# Patient Record
Sex: Female | Born: 1955
Health system: Southern US, Community
[De-identification: ages and names within clinical notes are randomized; demographics above are authoritative.]

## PROBLEM LIST (undated history)

## (undated) DIAGNOSIS — H269 Unspecified cataract: Secondary | ICD-10-CM

## (undated) DIAGNOSIS — I10 Essential (primary) hypertension: Secondary | ICD-10-CM

## (undated) DIAGNOSIS — J302 Other seasonal allergic rhinitis: Secondary | ICD-10-CM

## (undated) DIAGNOSIS — M1990 Unspecified osteoarthritis, unspecified site: Secondary | ICD-10-CM

## (undated) DIAGNOSIS — D649 Anemia, unspecified: Secondary | ICD-10-CM

## (undated) DIAGNOSIS — F4310 Post-traumatic stress disorder, unspecified: Secondary | ICD-10-CM

## (undated) DIAGNOSIS — E119 Type 2 diabetes mellitus without complications: Secondary | ICD-10-CM

## (undated) DIAGNOSIS — F32A Depression, unspecified: Secondary | ICD-10-CM

## (undated) DIAGNOSIS — F419 Anxiety disorder, unspecified: Secondary | ICD-10-CM

## (undated) DIAGNOSIS — F329 Major depressive disorder, single episode, unspecified: Secondary | ICD-10-CM

## (undated) DIAGNOSIS — E785 Hyperlipidemia, unspecified: Secondary | ICD-10-CM

## (undated) HISTORY — PX: TUBAL LIGATION: SHX77

## (undated) HISTORY — DX: Other seasonal allergic rhinitis: J30.2

## (undated) HISTORY — DX: Unspecified osteoarthritis, unspecified site: M19.90

## (undated) HISTORY — DX: Unspecified cataract: H26.9

## (undated) HISTORY — PX: ABDOMINAL HYSTERECTOMY: SHX81

## (undated) HISTORY — DX: Hyperlipidemia, unspecified: E78.5

---

## 1988-11-01 HISTORY — PX: TUBAL LIGATION: SHX77

## 2001-11-01 DIAGNOSIS — Z5189 Encounter for other specified aftercare: Secondary | ICD-10-CM

## 2001-11-01 HISTORY — PX: ABDOMINAL HYSTERECTOMY: SHX81

## 2001-11-01 HISTORY — DX: Encounter for other specified aftercare: Z51.89

## 2003-11-02 ENCOUNTER — Emergency Department (HOSPITAL_COMMUNITY): Admission: EM | Admit: 2003-11-02 | Discharge: 2003-11-02 | Payer: Self-pay | Admitting: Emergency Medicine

## 2004-09-05 ENCOUNTER — Emergency Department (HOSPITAL_COMMUNITY): Admission: EM | Admit: 2004-09-05 | Discharge: 2004-09-05 | Payer: Self-pay | Admitting: Emergency Medicine

## 2005-03-12 ENCOUNTER — Emergency Department (HOSPITAL_COMMUNITY): Admission: EM | Admit: 2005-03-12 | Discharge: 2005-03-12 | Payer: Self-pay | Admitting: Emergency Medicine

## 2006-03-21 ENCOUNTER — Ambulatory Visit: Payer: Self-pay | Admitting: Family Medicine

## 2006-04-08 ENCOUNTER — Emergency Department (HOSPITAL_COMMUNITY): Admission: EM | Admit: 2006-04-08 | Discharge: 2006-04-08 | Payer: Self-pay | Admitting: Emergency Medicine

## 2007-03-01 ENCOUNTER — Emergency Department (HOSPITAL_COMMUNITY): Admission: EM | Admit: 2007-03-01 | Discharge: 2007-03-01 | Payer: Self-pay | Admitting: Emergency Medicine

## 2007-05-13 ENCOUNTER — Emergency Department (HOSPITAL_COMMUNITY): Admission: EM | Admit: 2007-05-13 | Discharge: 2007-05-13 | Payer: Self-pay | Admitting: Emergency Medicine

## 2008-10-19 ENCOUNTER — Emergency Department (HOSPITAL_COMMUNITY): Admission: EM | Admit: 2008-10-19 | Discharge: 2008-10-19 | Payer: Self-pay | Admitting: Emergency Medicine

## 2009-04-22 ENCOUNTER — Encounter (INDEPENDENT_AMBULATORY_CARE_PROVIDER_SITE_OTHER): Payer: Self-pay | Admitting: Emergency Medicine

## 2009-04-22 ENCOUNTER — Emergency Department (HOSPITAL_COMMUNITY): Admission: EM | Admit: 2009-04-22 | Discharge: 2009-04-22 | Payer: Self-pay | Admitting: Emergency Medicine

## 2009-04-22 ENCOUNTER — Ambulatory Visit: Payer: Self-pay | Admitting: Surgery

## 2009-10-17 ENCOUNTER — Encounter: Admission: RE | Admit: 2009-10-17 | Discharge: 2009-10-17 | Payer: Self-pay | Admitting: Obstetrics and Gynecology

## 2009-11-01 HISTORY — PX: COLONOSCOPY: SHX174

## 2011-08-06 LAB — CBC
HCT: 42.9 % (ref 36.0–46.0)
Hemoglobin: 14.1 g/dL (ref 12.0–15.0)
MCHC: 32.8 g/dL (ref 30.0–36.0)
MCV: 87.3 fL (ref 78.0–100.0)
Platelets: 271 10*3/uL (ref 150–400)
RBC: 4.92 MIL/uL (ref 3.87–5.11)
RDW: 13.8 % (ref 11.5–15.5)
WBC: 5.8 10*3/uL (ref 4.0–10.5)

## 2011-08-06 LAB — URINALYSIS, ROUTINE W REFLEX MICROSCOPIC
Bilirubin Urine: NEGATIVE
Glucose, UA: NEGATIVE mg/dL
Ketones, ur: NEGATIVE mg/dL
Nitrite: NEGATIVE
Protein, ur: NEGATIVE mg/dL
Specific Gravity, Urine: 1.025 (ref 1.005–1.030)
Urobilinogen, UA: 0.2 mg/dL (ref 0.0–1.0)
pH: 6 (ref 5.0–8.0)

## 2011-08-06 LAB — BASIC METABOLIC PANEL
BUN: 8 mg/dL (ref 6–23)
CO2: 26 mEq/L (ref 19–32)
Calcium: 9.3 mg/dL (ref 8.4–10.5)
Chloride: 105 mEq/L (ref 96–112)
Creatinine, Ser: 0.69 mg/dL (ref 0.4–1.2)
GFR calc Af Amer: >60 mL/min (ref >60)
GFR calc non Af Amer: >60 mL/min (ref >60)
Glucose, Bld: 135 mg/dL — ABNORMAL HIGH (ref 70–99)
Potassium: 3.2 mEq/L — ABNORMAL LOW (ref 3.5–5.1)
Sodium: 139 mEq/L (ref 135–145)

## 2011-08-06 LAB — DIFFERENTIAL
Basophils Absolute: 0 10*3/uL (ref 0.0–0.1)
Basophils Relative: 0 % (ref 0–1)
Eosinophils Absolute: 0.2 10*3/uL (ref 0.0–0.7)
Eosinophils Relative: 3 % (ref 0–5)
Lymphocytes Relative: 29 % (ref 12–46)
Lymphs Abs: 1.7 10*3/uL (ref 0.7–4.0)
Monocytes Absolute: 0.3 10*3/uL (ref 0.1–1.0)
Monocytes Relative: 5 % (ref 3–12)
Neutro Abs: 3.6 10*3/uL (ref 1.7–7.7)
Neutrophils Relative %: 63 % (ref 43–77)

## 2011-08-06 LAB — URINE MICROSCOPIC-ADD ON

## 2011-08-06 LAB — URINE CULTURE: Colony Count: 40000

## 2011-11-18 ENCOUNTER — Emergency Department (HOSPITAL_BASED_OUTPATIENT_CLINIC_OR_DEPARTMENT_OTHER)
Admission: EM | Admit: 2011-11-18 | Discharge: 2011-11-18 | Disposition: A | Discharge status: Home / Self Care | Payer: No Typology Code available for payment source | Attending: Emergency Medicine | Admitting: Emergency Medicine

## 2011-11-18 ENCOUNTER — Encounter (HOSPITAL_BASED_OUTPATIENT_CLINIC_OR_DEPARTMENT_OTHER): Payer: Self-pay | Admitting: Emergency Medicine

## 2011-11-18 DIAGNOSIS — S161XXA Strain of muscle, fascia and tendon at neck level, initial encounter: Secondary | ICD-10-CM

## 2011-11-18 DIAGNOSIS — Y92410 Unspecified street and highway as the place of occurrence of the external cause: Secondary | ICD-10-CM | POA: Insufficient documentation

## 2011-11-18 DIAGNOSIS — M25559 Pain in unspecified hip: Secondary | ICD-10-CM

## 2011-11-18 DIAGNOSIS — S139XXA Sprain of joints and ligaments of unspecified parts of neck, initial encounter: Secondary | ICD-10-CM | POA: Insufficient documentation

## 2011-11-18 MED ORDER — METHOCARBAMOL 500 MG PO TABS: 500.0000 mg | ORAL_TABLET | Freq: Two times a day (BID) | ORAL | Status: AC

## 2011-11-18 MED ORDER — DEXAMETHASONE SODIUM PHOSPHATE 10 MG/ML IJ SOLN
10.0000 mg | Freq: Once | INTRAMUSCULAR | Status: AC
  Administered 2011-11-18: 10 mg via INTRAMUSCULAR
  Filled 2011-11-18: qty 1

## 2011-11-18 MED ORDER — METHOCARBAMOL 500 MG PO TABS
500.0000 mg | ORAL_TABLET | Freq: Once | ORAL | Status: AC
  Administered 2011-11-18: 500 mg via ORAL
  Filled 2011-11-18: qty 1

## 2011-11-18 MED ORDER — TRAMADOL HCL 50 MG PO TABS: 50.0000 mg | ORAL_TABLET | Freq: Four times a day (QID) | ORAL | Status: AC | PRN

## 2011-11-18 MED ORDER — KETOROLAC TROMETHAMINE 60 MG/2ML IM SOLN
60.0000 mg | Freq: Once | INTRAMUSCULAR | Status: AC
  Administered 2011-11-18: 60 mg via INTRAMUSCULAR
  Filled 2011-11-18: qty 2
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
## 2011-11-22 NOTE — ED Provider Notes (Signed)
Medical screening examination/treatment/procedure(s) were performed by non-physician practitioner and as supervising physician I was immediately available for consultation/collaboration.  Toy Baker, MD 11/22/11 1539

## 2012-06-20 ENCOUNTER — Emergency Department (HOSPITAL_COMMUNITY)
Admission: EM | Admit: 2012-06-20 | Discharge: 2012-06-20 | Disposition: A | Discharge status: Home / Self Care | Payer: Self-pay | Attending: Emergency Medicine | Admitting: Emergency Medicine

## 2012-06-20 ENCOUNTER — Other Ambulatory Visit: Payer: Self-pay

## 2012-06-20 ENCOUNTER — Encounter (HOSPITAL_COMMUNITY): Payer: Self-pay | Admitting: *Deleted

## 2012-06-20 DIAGNOSIS — R55 Syncope and collapse: Secondary | ICD-10-CM | POA: Insufficient documentation

## 2012-06-20 HISTORY — DX: Anemia, unspecified: D64.9

## 2012-06-20 LAB — BASIC METABOLIC PANEL
BUN: 6 mg/dL (ref 6–23)
CO2: 25 mEq/L (ref 19–32)
Calcium: 9 mg/dL (ref 8.4–10.5)
Chloride: 104 mEq/L (ref 96–112)
Creatinine, Ser: 0.67 mg/dL (ref 0.50–1.10)
GFR calc Af Amer: >90 mL/min (ref >90)
GFR calc non Af Amer: >90 mL/min (ref >90)
Glucose, Bld: 86 mg/dL (ref 70–99)
Potassium: 3.4 mEq/L — ABNORMAL LOW (ref 3.5–5.1)
Sodium: 139 mEq/L (ref 135–145)

## 2012-06-20 LAB — URINALYSIS, ROUTINE W REFLEX MICROSCOPIC
Bilirubin Urine: NEGATIVE
Glucose, UA: NEGATIVE mg/dL
Ketones, ur: NEGATIVE mg/dL
Leukocytes, UA: NEGATIVE
Nitrite: NEGATIVE
Protein, ur: NEGATIVE mg/dL
Specific Gravity, Urine: 1.008 (ref 1.005–1.030)
Urobilinogen, UA: 1 mg/dL (ref 0.0–1.0)
pH: 6.5 (ref 5.0–8.0)

## 2012-06-20 LAB — URINE MICROSCOPIC-ADD ON

## 2012-06-20 LAB — CBC
HCT: 40.2 % (ref 36.0–46.0)
Hemoglobin: 13.3 g/dL (ref 12.0–15.0)
MCH: 28.2 pg (ref 26.0–34.0)
MCHC: 33.1 g/dL (ref 30.0–36.0)
MCV: 85.4 fL (ref 78.0–100.0)
Platelets: 212 10*3/uL (ref 150–400)
RBC: 4.71 MIL/uL (ref 3.87–5.11)
RDW: 14.3 % (ref 11.5–15.5)
WBC: 6.4 10*3/uL (ref 4.0–10.5)

## 2012-06-20 LAB — TROPONIN I: Troponin I: <0.3 ng/mL (ref <0.30)

## 2012-06-20 MED ORDER — SODIUM CHLORIDE 0.9 % IV BOLUS (SEPSIS)
1000.0000 mL | Freq: Once | INTRAVENOUS | Status: AC
  Administered 2012-06-20: 1000 mL via INTRAVENOUS

## 2012-06-20 NOTE — ED Provider Notes (Signed)
History    D72-year-old female with near syncope. Earlier today patient was doing light housework when she began to feel shaky like her legs were going to collapse underneath her. Assisted to floor by her son who was nearby. She did not fall. She did not strike her head. She is not sure if she completely lost consciousness. She denies chest pain or shortness of breath. No seizure activity was reported. There was no incontinence.   CSN: [id number]  Arrival date & time 06/20/12  1542   First MD Initiated Contact with Patient 06/20/12 1550      Chief Complaint  Patient presents with  . Loss of Consciousness    (Consider location/radiation/quality/duration/timing/severity/associated sxs/prior treatment) HPI  Past Medical History  Diagnosis Date  . Anemia     Past Surgical History  Procedure Date  . Abdominal hysterectomy     History reviewed. No pertinent family history.  History  Substance Use Topics  . Smoking status: Not on file  . Smokeless tobacco: Not on file  . Alcohol Use:     OB History    Grav Para Term Preterm Abortions TAB SAB Ect Mult Living                  Review of Systems   Review of symptoms negative unless otherwise noted in HPI.   Allergies  Review of patient's allergies indicates no known allergies.  Home Medications   Current Outpatient Rx  Name Route Sig Dispense Refill  . VITAMIN D 1000 UNITS PO TABS Oral Take 1,000 Units by mouth daily.    Marland Kitchen FISH OIL 500 MG PO CAPS Oral Take 3 capsules by mouth daily.       BP 124/81  Pulse 78  Temp 98.5 F (36.9 C) (Oral)  Resp 20  SpO2 100%  Physical Exam  Nursing note and vitals reviewed. Constitutional: She is oriented to person, place, and time. She appears well-developed and well-nourished. No distress.       Laying in bed. NAD.  HENT:  Head: Normocephalic and atraumatic.  Eyes: Conjunctivae are normal. Pupils are equal, round, and reactive to light. Right eye exhibits no discharge.  Left eye exhibits no discharge.  Neck: Neck supple.  Cardiovascular: Normal rate, regular rhythm and normal heart sounds.  Exam reveals no gallop and no friction rub.   No murmur heard. Pulmonary/Chest: Effort normal and breath sounds normal. No respiratory distress.  Abdominal: Soft. She exhibits no distension. There is no tenderness.  Musculoskeletal: She exhibits no edema and no tenderness.       Lower extremities symmetric as compared to each other. No calf tenderness. Negative Homan's. No palpable cords.   Neurological: She is alert and oriented to person, place, and time. No cranial nerve deficit. She exhibits normal muscle tone. Coordination normal.  Skin: Skin is warm and dry. She is not diaphoretic.  Psychiatric: She has a normal mood and affect. Her behavior is normal. Thought content normal.    ED Course  Procedures (including critical care time)  Labs Reviewed  BASIC METABOLIC PANEL - Abnormal; Notable for the following:    Potassium 3.4 (*)     All other components within normal limits  URINALYSIS, ROUTINE W REFLEX MICROSCOPIC - Abnormal; Notable for the following:    APPearance HAZY (*)     Hgb urine dipstick TRACE (*)     All other components within normal limits  URINE MICROSCOPIC-ADD ON - Abnormal; Notable for the following:    Squamous  Epithelial / LPF FEW (*)     Bacteria, UA FEW (*)     All other components within normal limits  TROPONIN I  CBC  LAB REPORT - SCANNED   No results found.  EKG:  Rhythm: normal sinus Rate:  Axis: normal Intervals: normal ST segments: Ns ST changes anteriorly Comparison: stable from previous from 10/2008  1. Syncope       MDM  56yf with possible syncope. Etiology unclear. W/u unremarkable. HD stable. EKG with normal intervals. No hx of CHF. NO complaints through out ED stay. Return precautions discussed. Feel appropriate for outpt fu.        Raeford Razor, MD 06/25/12 [phone]

## 2012-06-20 NOTE — ED Notes (Signed)
Pt in via EMS- per EMS, pt in s/p syncopal episode, pt was working around house and stated her legs started to feel shaky and she started falling for the floor, son assisted patient to floor, pt did not fall. Per patient family pt did have syncopal episode, upon EMS arrival pt was alert and oriented, tearful. Pt denies neck or back pain, or other symptoms. Pt states she has not eaten today, has had one cup of coffee, CBG 92. Pt with history of similar episodes that PMD associated with anemia.

## 2013-03-21 ENCOUNTER — Other Ambulatory Visit: Payer: Self-pay

## 2013-03-21 ENCOUNTER — Other Ambulatory Visit: Payer: Self-pay | Admitting: Family Medicine

## 2013-04-04 ENCOUNTER — Other Ambulatory Visit (HOSPITAL_COMMUNITY): Payer: Self-pay | Admitting: Family Medicine

## 2013-04-04 DIAGNOSIS — Z1231 Encounter for screening mammogram for malignant neoplasm of breast: Secondary | ICD-10-CM

## 2013-04-12 ENCOUNTER — Ambulatory Visit (HOSPITAL_COMMUNITY)
Admission: RE | Admit: 2013-04-12 | Discharge: 2013-04-12 | Disposition: A | Discharge status: Home / Self Care | Payer: Self-pay | Source: Ambulatory Visit | Attending: Family Medicine | Admitting: Family Medicine

## 2013-04-12 DIAGNOSIS — Z1231 Encounter for screening mammogram for malignant neoplasm of breast: Secondary | ICD-10-CM

## 2013-06-15 ENCOUNTER — Encounter (HOSPITAL_COMMUNITY): Payer: Self-pay | Admitting: Emergency Medicine

## 2013-06-15 ENCOUNTER — Emergency Department (HOSPITAL_COMMUNITY)
Admission: EM | Admit: 2013-06-15 | Discharge: 2013-06-15 | Disposition: A | Discharge status: Home / Self Care | Attending: Emergency Medicine | Admitting: Emergency Medicine

## 2013-06-15 DIAGNOSIS — R55 Syncope and collapse: Secondary | ICD-10-CM | POA: Insufficient documentation

## 2013-06-15 DIAGNOSIS — Z79899 Other long term (current) drug therapy: Secondary | ICD-10-CM | POA: Insufficient documentation

## 2013-06-15 DIAGNOSIS — F329 Major depressive disorder, single episode, unspecified: Secondary | ICD-10-CM | POA: Insufficient documentation

## 2013-06-15 DIAGNOSIS — F411 Generalized anxiety disorder: Secondary | ICD-10-CM | POA: Insufficient documentation

## 2013-06-15 DIAGNOSIS — F3289 Other specified depressive episodes: Secondary | ICD-10-CM | POA: Insufficient documentation

## 2013-06-15 DIAGNOSIS — Z862 Personal history of diseases of the blood and blood-forming organs and certain disorders involving the immune mechanism: Secondary | ICD-10-CM | POA: Insufficient documentation

## 2013-06-15 NOTE — ED Notes (Signed)
Per EMS patient with Hx of depression was crying and lightheaded, then started to fall but caught herself. Per EMS this has happened before and usually occurs with stress, which she is feeling now due to lack of support system and employment issues. Patient is tearful, denies SI, denies HI.

## 2013-06-15 NOTE — ED Provider Notes (Addendum)
CSN: [id number]     Arrival date & time 06/15/13  1405 History     First MD Initiated Contact with Patient 06/15/13 1407     Chief Complaint  Patient presents with  . Loss of Consciousness   (Consider location/radiation/quality/duration/timing/severity/associated sxs/prior Treatment) HPI Comments: 57 year old female with a history of depression and anxiety who currently does not take any medications because she did not like the way to make her feel. She has 10 children, she lives with one of her daughters, each of the children participate in caring for the patient. She has no significant medical issues but she is unemployed and unable to find work. This has been a source of depression for the patient as she feels like she is not contributing to the family. She has had a lifelong history of anxiety and has syncopal episodes which becomes upset or anxious. She was on the phone call with one of her daughters when she became anxious, she hung up with the person in the a prompt syncopal episode. According to the daughter who accompanies her to the emergency department she has had this happen 100 sometimes in the past. She does not have any medical disorders prompting this according to the patient and her family members. She denies chest pain, shortness of breath, palpitations, headache, blurred vision, fevers chills nausea vomiting dysuria diarrhea or any other complaints.  Patient is a 57 y.o. female presenting with syncope. The history is provided by the patient, a relative and the EMS personnel.  Loss of Consciousness   Past Medical History  Diagnosis Date  . Anemia    Past Surgical History  Procedure Laterality Date  . Abdominal hysterectomy     History reviewed. No pertinent family history. History  Substance Use Topics  . Smoking status: Not on file  . Smokeless tobacco: Not on file  . Alcohol Use:    OB History   Grav Para Term Preterm Abortions TAB SAB Ect Mult Living       Review of Systems  Cardiovascular: Positive for syncope.  All other systems reviewed and are negative.    Allergies  Review of patient's allergies indicates no known allergies.  Home Medications   Current Outpatient Rx  Name  Route  Sig  Dispense  Refill  . omega-3 acid ethyl esters (LOVAZA) 1 G capsule   Oral   Take 1 g by mouth daily.          There were no vitals taken for this visit. Physical Exam  Nursing note and vitals reviewed. Constitutional: She appears well-developed and well-nourished. No distress.  HENT:  Head: Normocephalic and atraumatic.  Mouth/Throat: Oropharynx is clear and moist. No oropharyngeal exudate.  Eyes: Conjunctivae and EOM are normal. Pupils are equal, round, and reactive to light. Right eye exhibits no discharge. Left eye exhibits no discharge. No scleral icterus.  Neck: Normal range of motion. Neck supple. No JVD present. No thyromegaly present.  Cardiovascular: Normal rate, regular rhythm, normal heart sounds and intact distal pulses.  Exam reveals no gallop and no friction rub.   No murmur heard. Pulmonary/Chest: Effort normal and breath sounds normal. No respiratory distress. She has no wheezes. She has no rales.  Abdominal: Soft. Bowel sounds are normal. She exhibits no distension and no mass. There is no tenderness.  Musculoskeletal: Normal range of motion. She exhibits no edema and no tenderness.  Lymphadenopathy:    She has no cervical adenopathy.  Neurological: She is alert. Coordination normal.  Skin: Skin  is warm and dry. No rash noted. No erythema.  Psychiatric:  Tearful, anxious, no hallucinations, no suicidal thoughts    ED Course   Procedures (including critical care time)  Labs Reviewed - No data to display No results found. 1. Syncope   2. Depression     MDM  Overall the patient appears to be in good physical health, she has no cardiac abnormalities on exam, normal vital signs, soft nontender abdomen and a  clear mental status. She readily admits that her depression and anxiety comes from the fact that she is unable to contribute financially to her family and feels like a "leech".   The patient appears hemodynamically stable, she will get an EKG to evaluate for any other signs of arrhythmia however with no other symptoms and her frequent history of syncope there is no indication for further workup at this time. She refuses ongoing depression medications but will followup with psychiatry for ongoing counseling. She denies SI / HI to me though she has an ongoing feeling of guilt that her family members have to take care of her financially  I do not feel that she is a danger to herself or others at this time.  She lives with a daughter who she loves and loves her and is very loving interaction at the bedside.   ED ECG REPORT  I personally interpreted this EKG   Date: 06/15/2013   Rate: 71  Rhythm: normal sinus rhythm  QRS Axis: normal  Intervals: normal  ST/T Wave abnormalities: normal  Conduction Disutrbances:none  Narrative Interpretation:   Old EKG Reviewed: 06/20/12 -  no sig changes  No sig findings on exam, no arrhythmias, pt stable for d/c.    Vida Roller, MD 06/15/13 1515  Vida Roller, MD 06/15/13 1556

## 2013-06-15 NOTE — ED Notes (Signed)
MD at bedside. 

## 2013-06-15 NOTE — ED Notes (Signed)
Bed: [facility] Expected date:  Expected time:  Means of arrival:  Comments: EMS-syncopal

## 2014-05-22 ENCOUNTER — Other Ambulatory Visit (HOSPITAL_COMMUNITY): Payer: Self-pay | Admitting: Family Medicine

## 2014-05-22 DIAGNOSIS — Z1231 Encounter for screening mammogram for malignant neoplasm of breast: Secondary | ICD-10-CM

## 2014-05-31 ENCOUNTER — Ambulatory Visit (HOSPITAL_COMMUNITY)
Admission: RE | Admit: 2014-05-31 | Discharge: 2014-05-31 | Disposition: A | Discharge status: Home / Self Care | Source: Ambulatory Visit | Attending: Family Medicine | Admitting: Family Medicine

## 2014-05-31 DIAGNOSIS — Z1231 Encounter for screening mammogram for malignant neoplasm of breast: Secondary | ICD-10-CM

## 2015-04-23 ENCOUNTER — Other Ambulatory Visit (HOSPITAL_COMMUNITY): Payer: Self-pay | Admitting: Primary Care

## 2015-04-23 DIAGNOSIS — Z1231 Encounter for screening mammogram for malignant neoplasm of breast: Secondary | ICD-10-CM

## 2015-06-02 ENCOUNTER — Ambulatory Visit (HOSPITAL_COMMUNITY)
Admission: RE | Admit: 2015-06-02 | Discharge: 2015-06-02 | Disposition: A | Discharge status: Home / Self Care | Source: Ambulatory Visit | Attending: Primary Care | Admitting: Primary Care

## 2015-06-02 DIAGNOSIS — Z1231 Encounter for screening mammogram for malignant neoplasm of breast: Secondary | ICD-10-CM

## 2015-07-14 ENCOUNTER — Emergency Department (HOSPITAL_COMMUNITY)
Admission: EM | Admit: 2015-07-14 | Discharge: 2015-07-14 | Disposition: A | Discharge status: Other Acute Inpatient Hospital | Attending: Emergency Medicine | Admitting: Emergency Medicine

## 2015-07-14 ENCOUNTER — Inpatient Hospital Stay (HOSPITAL_COMMUNITY)
Admission: AD | Admit: 2015-07-14 | Discharge: 2015-07-17 | DRG: 885 | Disposition: A | Discharge status: Home / Self Care | Payer: Federal, State, Local not specified - Other | Source: Intra-hospital | Attending: Psychiatry | Admitting: Psychiatry

## 2015-07-14 ENCOUNTER — Encounter (HOSPITAL_COMMUNITY): Payer: Self-pay | Admitting: *Deleted

## 2015-07-14 ENCOUNTER — Encounter (HOSPITAL_COMMUNITY): Payer: Self-pay

## 2015-07-14 DIAGNOSIS — E119 Type 2 diabetes mellitus without complications: Secondary | ICD-10-CM | POA: Insufficient documentation

## 2015-07-14 DIAGNOSIS — F29 Unspecified psychosis not due to a substance or known physiological condition: Secondary | ICD-10-CM | POA: Clinically undetermined

## 2015-07-14 DIAGNOSIS — F329 Major depressive disorder, single episode, unspecified: Secondary | ICD-10-CM | POA: Diagnosis present

## 2015-07-14 DIAGNOSIS — I10 Essential (primary) hypertension: Secondary | ICD-10-CM | POA: Diagnosis present

## 2015-07-14 DIAGNOSIS — Z62810 Personal history of physical and sexual abuse in childhood: Secondary | ICD-10-CM | POA: Diagnosis present

## 2015-07-14 DIAGNOSIS — F419 Anxiety disorder, unspecified: Secondary | ICD-10-CM | POA: Insufficient documentation

## 2015-07-14 DIAGNOSIS — Z833 Family history of diabetes mellitus: Secondary | ICD-10-CM

## 2015-07-14 DIAGNOSIS — F23 Brief psychotic disorder: Principal | ICD-10-CM | POA: Clinically undetermined

## 2015-07-14 DIAGNOSIS — F4310 Post-traumatic stress disorder, unspecified: Secondary | ICD-10-CM | POA: Diagnosis present

## 2015-07-14 DIAGNOSIS — Z809 Family history of malignant neoplasm, unspecified: Secondary | ICD-10-CM

## 2015-07-14 DIAGNOSIS — Z79899 Other long term (current) drug therapy: Secondary | ICD-10-CM | POA: Insufficient documentation

## 2015-07-14 DIAGNOSIS — Z862 Personal history of diseases of the blood and blood-forming organs and certain disorders involving the immune mechanism: Secondary | ICD-10-CM | POA: Insufficient documentation

## 2015-07-14 HISTORY — DX: Anxiety disorder, unspecified: F41.9

## 2015-07-14 HISTORY — DX: Major depressive disorder, single episode, unspecified: F32.9

## 2015-07-14 HISTORY — DX: Type 2 diabetes mellitus without complications: E11.9

## 2015-07-14 HISTORY — DX: Depression, unspecified: F32.A

## 2015-07-14 HISTORY — DX: Essential (primary) hypertension: I10

## 2015-07-14 LAB — RAPID URINE DRUG SCREEN, HOSP PERFORMED
AMPHETAMINES: NOT DETECTED
BARBITURATES: NOT DETECTED
Benzodiazepines: NOT DETECTED
Cocaine: NOT DETECTED
Opiates: NOT DETECTED
TETRAHYDROCANNABINOL: NOT DETECTED

## 2015-07-14 LAB — COMPREHENSIVE METABOLIC PANEL
ALBUMIN: 4.6 g/dL (ref 3.5–5.0)
ALT: 18 U/L (ref 14–54)
AST: 29 U/L (ref 15–41)
Alkaline Phosphatase: 77 U/L (ref 38–126)
Anion gap: 8 (ref 5–15)
BILIRUBIN TOTAL: 0.3 mg/dL (ref 0.3–1.2)
BUN: 9 mg/dL (ref 6–20)
CHLORIDE: 106 mmol/L (ref 101–111)
CO2: 26 mmol/L (ref 22–32)
Calcium: 9.6 mg/dL (ref 8.9–10.3)
Creatinine, Ser: 0.9 mg/dL (ref 0.44–1.00)
GFR calc Af Amer: >60 mL/min (ref >60)
GFR calc non Af Amer: >60 mL/min (ref >60)
GLUCOSE: 104 mg/dL — AB (ref 65–99)
POTASSIUM: 3.5 mmol/L (ref 3.5–5.1)
SODIUM: 140 mmol/L (ref 135–145)
Total Protein: 8.6 g/dL — ABNORMAL HIGH (ref 6.5–8.1)

## 2015-07-14 LAB — CBC
HEMATOCRIT: 43.2 % (ref 36.0–46.0)
HEMOGLOBIN: 14.4 g/dL (ref 12.0–15.0)
MCH: 28 pg (ref 26.0–34.0)
MCHC: 33.3 g/dL (ref 30.0–36.0)
MCV: 83.9 fL (ref 78.0–100.0)
Platelets: 293 10*3/uL (ref 150–400)
RBC: 5.15 MIL/uL — ABNORMAL HIGH (ref 3.87–5.11)
RDW: 14.2 % (ref 11.5–15.5)
WBC: 6.4 10*3/uL (ref 4.0–10.5)

## 2015-07-14 LAB — ETHANOL: Alcohol, Ethyl (B): <5 mg/dL (ref <5)

## 2015-07-14 MED ORDER — HYDROXYZINE HCL 25 MG PO TABS: 25.0000 mg | ORAL_TABLET | Freq: Four times a day (QID) | ORAL | Status: DC | PRN

## 2015-07-14 MED ORDER — ALUM & MAG HYDROXIDE-SIMETH 200-200-20 MG/5ML PO SUSP: 30.0000 mL | ORAL | Status: DC | PRN

## 2015-07-14 MED ORDER — LORAZEPAM 1 MG PO TABS: 1.0000 mg | ORAL_TABLET | Freq: Three times a day (TID) | ORAL | Status: DC | PRN

## 2015-07-14 MED ORDER — TRAZODONE HCL 50 MG PO TABS: 50.0000 mg | ORAL_TABLET | Freq: Every evening | ORAL | Status: DC | PRN

## 2015-07-14 MED ORDER — ONDANSETRON HCL 4 MG PO TABS: 4.0000 mg | ORAL_TABLET | Freq: Three times a day (TID) | ORAL | Status: DC | PRN

## 2015-07-14 MED ORDER — ACETAMINOPHEN 325 MG PO TABS
650.0000 mg | ORAL_TABLET | ORAL | Status: DC | PRN
  Administered 2015-07-14: 650 mg via ORAL
  Filled 2015-07-14: qty 2

## 2015-07-14 MED ORDER — NICOTINE 21 MG/24HR TD PT24: 21.0000 mg | MEDICATED_PATCH | Freq: Every day | TRANSDERMAL | Status: DC

## 2015-07-14 MED ORDER — IBUPROFEN 200 MG PO TABS: 600.0000 mg | ORAL_TABLET | Freq: Three times a day (TID) | ORAL | Status: DC | PRN

## 2015-07-14 MED ORDER — MAGNESIUM HYDROXIDE 400 MG/5ML PO SUSP: 30.0000 mL | Freq: Every day | ORAL | Status: DC | PRN

## 2015-07-14 MED ORDER — ACETAMINOPHEN 325 MG PO TABS: 650.0000 mg | ORAL_TABLET | Freq: Four times a day (QID) | ORAL | Status: DC | PRN
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
## 2015-07-14 NOTE — Progress Notes (Signed)
Patient listed as having Tricare insurance without a pcp.  EDCM spoke to patient at bedside.  Patient reports she does not have [employer].  Patient confirms she has the orange card and her pcp is located at the [facility] [place].  System updated.

## 2015-07-14 NOTE — ED Notes (Signed)
Patient has been wanded by secuirty. Patient belongings were taken to the car by daughter Maudie Mercury.
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
## 2015-07-14 NOTE — Progress Notes (Signed)
59 year old female pt admitted on voluntary basis. On admission, pt reports that she had a breakdown earlier and her children brought her into the hospital because they were concerned about her. Pt denies any feelings of depression or SI on admission but did endorse that she was feeling suicidal earlier. Pt able to contract for safety on the unit. Pt reports that she lives with her daughter and will go back there after discharge. Pt reports that she needs to get rid of her past. Pt denies any recent current mental health treatment and denies being on any medications other than some BP medications. Pt was oriented to unit and safety maintained.

## 2015-07-14 NOTE — ED Notes (Signed)
Patient goes by "Katie Simon". Pleasant, cooperative. Denies complaint. Denies SI, HI, AVH and feelings of depression and anxiety at present. Hx of syncope, reports no falls in the last year. Patient has support of her family with multiple visitors.  Encouragement offered.  Q 15 safety checks in place.
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
## 2015-07-14 NOTE — ED Notes (Signed)
Patient ambulated back to the unit from triage.  She was oriented to the unit and to her surroundings.  She states she is no longer hallucinating or having bad thoughts like she was when she came in today.  She is pleasant and makes good eye contact.  When advised she would likely have to stay the night and see the psychiatrist in the morning.  She stated that was fine.  She requested a snack and some ice water which I provided.
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
## 2015-07-14 NOTE — Progress Notes (Signed)
Received report on patient from the admit nurse    Introduced myself to pt as she was in her room getting ready to take a shower    She said all she needed to do was take a shower and go to bed for the night   She was pleasant and cooperative during our brief interaction    Reassured pt of safety and staff  availability for her during the night

## 2015-07-14 NOTE — BH Assessment (Signed)
Informed Tatyana, PA of acceptance to [facility] bed, 504-2 under the care of Dr. Shea Evans. Megan RN aware. Support paperwork has been completed. Megan RN coordinating with adult unit for arrival time.    Lear Ng, [facility] Triage Specialist 07/14/2015 10:09 PM

## 2015-07-15 ENCOUNTER — Encounter (HOSPITAL_COMMUNITY): Payer: Self-pay | Admitting: Psychiatry

## 2015-07-15 DIAGNOSIS — F4310 Post-traumatic stress disorder, unspecified: Secondary | ICD-10-CM

## 2015-07-15 DIAGNOSIS — F29 Unspecified psychosis not due to a substance or known physiological condition: Secondary | ICD-10-CM | POA: Clinically undetermined

## 2015-07-15 MED ORDER — HYDROCHLOROTHIAZIDE 25 MG PO TABS
25.0000 mg | ORAL_TABLET | Freq: Every day | ORAL | Status: DC
  Administered 2015-07-15 – 2015-07-17 (×3): 25 mg via ORAL
  Filled 2015-07-15 (×5): qty 1

## 2015-07-15 NOTE — Progress Notes (Signed)
D: Undersign assisted pt in filling out self inventory form; pt reports she does not currently have her reading classes. Pt behavior calm and cooperative. Pleasant during conversation. Per self inventory form pt reports she slept fair last night. She reports a fair appetite, normal energy level, good concentration. She rates depression 1/10, hopelessness 0/10, anxiety 0/10- all on 0-10 scale, 10 being the worse. She denies SI/HI. Denies AVH. She denies physical problems and physical pain. Pt reports her goal for the day is "getting better, to get home." She reports "talk to someone" will help her meet her goal. Pt attended nursing group on the unit. Active participation in group.  A:Special checks q 15 mins in place for safety.  Encouragement and support provided.   R: Safety maintained. Will continue to monitor.
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
## 2015-07-15 NOTE — BHH Suicide Risk Assessment (Signed)
[facility] Admission Suicide Risk Assessment   Nursing information obtained from:    Demographic factors:    Current Mental Status:    Loss Factors:    Historical Factors:    Risk Reduction Factors:    Total Time spent with patient: 30 minutes Principal Problem: PTSD (post-traumatic stress disorder) Diagnosis:   Patient Active Problem List   Diagnosis Date Noted  . PTSD (post-traumatic stress disorder) [F43.10] 07/15/2015  . Psychosis [F29] 07/15/2015     Continued Clinical Symptoms:  Alcohol Use Disorder Identification Test Final Score (AUDIT): 0 The "Alcohol Use Disorders Identification Test", Guidelines for Use in Primary Care, Second Edition.  World Pharmacologist [facility]). Score between 0-7:  no or low risk or alcohol related problems. Score between 8-15:  moderate risk of alcohol related problems. Score between 16-19:  high risk of alcohol related problems. Score 20 or above:  warrants further diagnostic evaluation for alcohol dependence and treatment.   CLINICAL FACTORS:   Previous Psychiatric Diagnoses and Treatments Medical Diagnoses and Treatments/Surgeries   Musculoskeletal: Strength & Muscle Tone: within normal limits Gait & Station: normal Patient leans: N/A  Psychiatric Specialty Exam: Physical Exam  ROS  Blood pressure 136/70, pulse 66, temperature 98.3 F (36.8 C), temperature source Oral, resp. rate 16, height 4\' 11"  (1.499 m), weight 77.565 kg (171 lb).Body mass index is 34.52 kg/(m^2).  Please see H&P.                                                        COGNITIVE FEATURES THAT CONTRIBUTE TO RISK:  Closed-mindedness, Polarized thinking and Thought constriction (tunnel vision)    SUICIDE RISK:   Mild:  Suicidal ideation of limited frequency, intensity, duration, and specificity.  There are no identifiable plans, no associated intent, mild dysphoria and related symptoms, good self-control (both objective and subjective  assessment), few other risk factors, and identifiable protective factors, including available and accessible social support.  PLAN OF CARE: Please see H&P.   Medical Decision Making:  Review of Psycho-Social Stressors (1), Review or order clinical lab tests (1), Established Problem, Worsening (2), Review of Medication Regimen & Side Effects (2) and Review of New Medication or Change in Dosage (2)  I certify that inpatient services furnished can reasonably be expected to improve the patient's condition.   Tyquan Carmickle md 07/15/2015, 1:05 PM

## 2015-07-15 NOTE — BHH Group Notes (Signed)
[place] Group Notes:  (Nursing/MHT/Case Management/Adjunct)  Date:  07/15/2015  Time:0930  Type of Therapy:  Nurse Education  Participation Level:  Active  Participation Quality:  Appropriate and Attentive  Affect:  Appropriate  Cognitive:  Alert and Appropriate  Insight:  Appropriate  Engagement in Group:  Engaged  Modes of Intervention:  Activity, Discussion, Education, Orientation, Socialization and Support  Summary of Progress/Problems:  Forrestine Him 07/15/2015, 10:18 AM
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
## 2015-07-15 NOTE — Plan of Care (Signed)
Problem: Diagnosis: Increased Risk For Suicide Attempt Goal: STG-Patient Will Comply With Medication Regime Outcome: Progressing Pt compliant with medication regimen     

## 2015-07-15 NOTE — [facility] Group Notes (Signed)
[place] [place] LCSW Group Therapy  07/15/2015 1:15 pm  Type of Therapy: Process Group Therapy  Participation Level:  Active  Participation Quality:  Appropriate  Affect:  Flat  Cognitive:  Oriented  Insight:  Improving  Engagement in Group:  Limited  Engagement in Therapy:  Limited  Modes of Intervention:  Activity, Clarification, Education, Problem-solving and Support  Summary of Progress/Problems: Today's group addressed the issue of overcoming obstacles.  Patients were asked to identify their biggest obstacle post d/c that stands in the way of their on-going success, and then problem solve as to how to manage this.  Stayed the entire time.  Engaged throughout.  Talked about challenged with her family.  Feels guilty when she does not do for them, but also realizes she is more focused on them than taking care of herself, and that has led to problems.  "I mean, why am I invested in a relationship with someone when the only person showing investment is me?"  Talked about the power of prayer and faith for her, as well as the support of her church family.  Roque Lias B 07/15/2015   5:07 PM

## 2015-07-15 NOTE — Plan of Care (Signed)
Problem: Diagnosis: Increased Risk For Suicide Attempt Goal: STG-Patient Will Attend All Groups On The Unit Outcome: Progressing Pt attending groups on the unit.

## 2015-07-15 NOTE — Progress Notes (Signed)
Adult Psychoeducational Group Note  Date:  07/15/2015 Time:  8:15 PM  Group Topic/Focus:  Wrap-Up Group:   The focus of this group is to help patients review their daily goal of treatment and discuss progress on daily workbooks.  Participation Level:  Active  Participation Quality:  Attentive  Affect:  Appropriate  Cognitive:  Appropriate  Insight: Good  Engagement in Group:  Engaged  Modes of Intervention:  Discussion  Additional Comments:  Pt was very pleasant during wrap-up group. Pt rated her overall day a 10 out of 10. Pt shared that her family, prayer, and praying with her family was what made her day so positive. Pt stated that she feels that she achieved her goal for the day which was to get better and sleep better.   Lincoln Brigham 07/15/2015, 8:45 PM

## 2015-07-16 DIAGNOSIS — F23 Brief psychotic disorder: Secondary | ICD-10-CM | POA: Clinically undetermined
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
## 2015-07-16 NOTE — Progress Notes (Signed)
Pt stated that she had an awesome day. She has learned a lot from groups and is looking forward to going home tomorrow. Her goal is to help her kids help her out and not fuss with them.
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
## 2015-07-16 NOTE — BHH Group Notes (Signed)
[facility] LCSW Group Therapy  07/16/2015 2:45 PM  Type of Therapy: Group Therapy  Participation Level: Active  Participation Quality: Attentive  Affect: Flat  Cognitive: Oriented  Insight: Limited  Engagement in Therapy: Engaged  Modes of Intervention: Discussion and Socialization  Summary of Progress/Problems: Katie Simon from the [place] was here to tell his story of recovery and play his guitar. Pt was pleasant and appropriate in group. She seemed interested in the discussion and stayed alert throughout the group.  Katie Simon. Katie Simon 07/16/2015 2:45 PM

## 2015-07-16 NOTE — BHH Suicide Risk Assessment (Signed)
[place] INPATIENT:  Family/Significant Other Suicide Prevention Education  Suicide Prevention Education:  Education Completed; Fradel Baldonado, son, [phone] has been identified by the patient as the family member/significant other with whom the patient will be residing, and identified as the person(s) who will aid the patient in the event of a mental health crisis (suicidal ideations/suicide attempt).  With written consent from the patient, the family member/significant other has been provided the following suicide prevention education, prior to the and/or following the discharge of the patient.  The suicide prevention education provided includes the following:  Suicide risk factors  Suicide prevention and interventions  National Suicide Hotline telephone number  [facility] assessment telephone number  [facility] Emergency Assistance [place] and/or Residential Mobile Crisis Unit telephone number  Request made of family/significant other to:  Remove weapons (e.g., guns, rifles, knives), all items previously/currently identified as safety concern.    Remove drugs/medications (over-the-counter, prescriptions, illicit drugs), all items previously/currently identified as a safety concern.  The family member/significant other verbalizes understanding of the suicide prevention education information provided.  The family member/significant other agrees to remove the items of safety concern listed above.  Roque Lias B 07/16/2015, 4:15 PM

## 2015-07-16 NOTE — Progress Notes (Signed)
Room changed 404/2

## 2015-07-16 NOTE — Progress Notes (Signed)
D: Pt is alert and oriented x 4. Pt at this time denies depression and anxiety; she states, "I feel real good; I do not have any depression and anxiety; the doctor said I may be leaving on Wednesday and I think am ready; feel just fine." Pt at this time also denes SI/HI/AVH and pain. Pt was observed having meaningful conversations with peers and staffs. Pt continues to be very pleasant and cooperative.   A: Support, encouragement, and safe environment provided.   R: Pt had no schedule medications, refused all PRNs offered. Pt attended group.  15-minute safety check continues

## 2015-07-16 NOTE — Plan of Care (Signed)
Problem: Alteration in mood Goal: LTG-Patient reports reduction in suicidal thoughts (Patient reports reduction in suicidal thoughts and is able to verbalize a safety plan for whenever patient is feeling suicidal)  Outcome: Progressing Pt denies suicidal thoughts at this time.

## 2015-07-16 NOTE — Progress Notes (Addendum)
D:Per patient self inventory form pt reports she slept good last night. She reports a good appetite, normal energy level, good concentration. She rates depression 0/10, hopelessness 0/10, anxiety 0/10- all on 0-10 scale, 10 being the worse. She denies SI/HI. Denies AVH. She denies physical pain. She denies physical problems. Pt pleasant on approach. She voices no complaints. Pt reports she had a good night and had visitors she reports her goal for the day is "to keep praying".  A: Special checks q 15 mins in place for safety. Medication administered per MD order (see eMAR). Encouragement and support provided.  R: Compliant with medication regimen. Safety maintained. Will continue to monitor.

## 2015-07-17 MED ORDER — HYDROXYZINE HCL 25 MG PO TABS: 25.0000 mg | ORAL_TABLET | Freq: Four times a day (QID) | ORAL | Status: DC | PRN

## 2015-07-17 MED ORDER — TRAZODONE HCL 50 MG PO TABS: 50.0000 mg | ORAL_TABLET | Freq: Every evening | ORAL | Status: DC | PRN

## 2015-07-17 MED ORDER — HYDROCHLOROTHIAZIDE 25 MG PO TABS: 25.0000 mg | ORAL_TABLET | Freq: Every day | ORAL | Status: DC

## 2015-07-17 NOTE — BHH Group Notes (Signed)
[employer] Group Notes:  (Nursing/MHT/Case Management/Adjunct)  Date:  07/17/2015  Time:  0900  Type of Therapy:  Nurse Education - Self Care  Participation Level:  Active  Participation Quality:  Appropriate  Affect:  Appropriate  Cognitive:  Appropriate  Insight:  Appropriate  Engagement in Group:  Engaged  Modes of Intervention:  Discussion and Education  Summary of Progress/Problems: Patient participated and shared in exercise and discussion regarding self care. Displayed appropriate insight and was engaged.   Loletta Specter [facility] 07/17/2015, [phone]

## 2015-07-17 NOTE — Progress Notes (Signed)
  [facility] Adult Case Management Discharge Plan :  Will you be returning to the same living situation after discharge:  Yes,  home with daughter At discharge, do you have transportation home?: Yes,  family Do you have the ability to pay for your medications: Yes,  No mental health meds  Release of information consent forms completed and in the chart;  Patient's signature needed at discharge.  Patient to Follow up at: Follow-up Information    Follow [facility] with [facility] On 07/22/2015.   Why:  Tuesday at 2:00 with Verlin Grills   Contact information:   [place] [phone]      Patient denies SI/HI: Yes,  yes    Safety Planning and Suicide Prevention discussed: Yes,  yes  Have you used any form of tobacco in the last 30 days? (Cigarettes, Smokeless Tobacco, Cigars, and/or Pipes): No  Has patient been referred to the Quitline?: N/A patient is not a smoker  [place] B 07/17/2015, 9:57 AM

## 2015-07-17 NOTE — BHH Suicide Risk Assessment (Signed)
[facility] Discharge Suicide Risk Assessment   Demographic Factors:  Divorced or widowed  Total Time spent with patient: 30 minutes  Musculoskeletal: Strength & Muscle Tone: within normal limits Gait & Station: normal Patient leans: N/A  Psychiatric Specialty Exam: Physical Exam  Review of Systems  Psychiatric/Behavioral: Negative for depression, suicidal ideas and hallucinations. The patient is not nervous/anxious and does not have insomnia.   All other systems reviewed and are negative.   Blood pressure 125/81, pulse 72, temperature 98.2 F (36.8 C), temperature source Oral, resp. rate 16, height 4\' 11"  (1.499 m), weight 77.565 kg (171 lb).Body mass index is 34.52 kg/(m^2).  General Appearance: Casual  Eye Contact::  Fair  Speech:  Clear and [id number]  Volume:  Normal  Mood:  Euthymic  Affect:  Appropriate  Thought Process:  Coherent  Orientation:  Full (Time, Place, and Person)  Thought Content:  WDL  Suicidal Thoughts:  No  Homicidal Thoughts:  No  Memory:  Immediate;   Fair Recent;   Fair Remote;   Fair  Judgement:  Fair  Insight:  Fair  Psychomotor Activity:  Normal  Concentration:  Fair  Recall:  [employer] of Knowledge:Fair  Language: Fair  Akathisia:  No  Handed:  Right  AIMS (if indicated):     Assets:  Communication Skills Desire for Improvement  Sleep:  Number of Hours: 6.75  Cognition: WNL  ADL's:  Intact   Have you used any form of tobacco in the last 30 days? (Cigarettes, Smokeless Tobacco, Cigars, and/or Pipes): No  Has this patient used any form of tobacco in the last 30 days? (Cigarettes, Smokeless Tobacco, Cigars, and/or Pipes) No  Mental Status Per Nursing Assessment::   On Admission:     Current Mental Status by Physician: Pt denies SI/HI/AH/VH  Loss Factors: NA  Historical Factors: Impulsivity  Risk Reduction Factors:   Sense of responsibility to family, Religious beliefs about death, Living with another person, especially a  relative, Positive social support, Positive therapeutic relationship and Positive coping skills or problem solving skills  Continued Clinical Symptoms:  Previous Psychiatric Diagnoses and Treatments  Cognitive Features That Contribute To Risk:  None    Suicide Risk:  Minimal: No identifiable suicidal ideation.  Patients presenting with no risk factors but with morbid ruminations; may be classified as minimal risk based on the severity of the depressive symptoms  Principal Problem: Brief psychotic disorder Discharge Diagnoses:  Patient Active Problem List   Diagnosis Date Noted  . Brief psychotic disorder [F23] 07/16/2015  . PTSD (post-traumatic stress disorder) [F43.10] 07/15/2015  . Psychosis [F29] 07/15/2015    Follow-up Information    Follow [facility] with [facility].   Contact information:   [place] [id number]      Plan Of Care/Follow-up recommendations:  Activity:  No restrictions Diet:  low sodium, heart healthy Tests:  as needed Other:  follow up with after care  Is patient on multiple antipsychotic therapies at discharge:  No   Has Patient had three or more failed trials of antipsychotic monotherapy by history:  No  Recommended Plan for Multiple Antipsychotic Therapies: NA    Katie Mireles md 07/17/2015, 9:23 AM

## 2015-07-17 NOTE — Plan of Care (Signed)
Problem: Ineffective individual coping Goal: LTG: Patient will report a decrease in negative feelings Outcome: Progressing Pt reports her day was "awesome" and "I'm just feeling a whole lot better."  She smiled occasionally tonight and reported she had a good visit with her family.

## 2015-07-17 NOTE — Discharge Summary (Signed)
Physician Discharge Summary Note  Patient:  Katie Simon is an 59 y.o., female MRN:  [id number] DOB:  [birth year] Patient phone:  [phone] (home)  Patient address:   [place] [address],  Total Time spent with patient: 30 minutes  Date of Admission:  07/14/2015 Date of Discharge: 07/17/2015  Reason for Admission:  psychosis  Principal Problem: Brief psychotic disorder Discharge Diagnoses: Patient Active Problem List   Diagnosis Date Noted  . Brief psychotic disorder [F23] 07/16/2015  . PTSD (post-traumatic stress disorder) [F43.10] 07/15/2015  . Psychosis [F29] 07/15/2015    Musculoskeletal: Strength & Muscle Tone: within normal limits Gait & Station: normal Patient leans: N/A  Psychiatric Specialty Exam:  SEE SRA Physical Exam  Vitals reviewed.   Review of Systems  All other systems reviewed and are negative.   Blood pressure 125/81, pulse 72, temperature 98.2 F (36.8 C), temperature source Oral, resp. rate 16, height _0  (1.499 m), weight 77.565 kg (171 lb).Body mass index is 34.52 kg/(m^2).  Have you used any form of tobacco in the last 30 days? (Cigarettes, Smokeless Tobacco, Cigars, and/or Pipes): No  Has this patient used any form of tobacco in the last 30 days? (Cigarettes, Smokeless Tobacco, Cigars, and/or Pipes) N/A  Past Medical History:  Past Medical History  Diagnosis Date  . Anemia   . Hypertension   . Diabetes mellitus without complication   . Depression   . Anxiety     Past Surgical History  Procedure Laterality Date  . Abdominal hysterectomy    . Tubal ligation     Family History:  Family History  Problem Relation Age of Onset  . Diabetes Mother   . Cancer Mother   . Aneurysm Mother   . Diabetes Father   . Cancer Father   . Cancer Sister   . Cancer Brother    Social History:  History  Alcohol Use No     History  Drug Use No    Social History   Social History  . Marital Status: Single   Spouse Name: N/A  . Number of Children: N/A  . Years of Education: N/A   Social History Main Topics  . Smoking status: Never Smoker   . Smokeless tobacco: Never Used  . Alcohol Use: No  . Drug Use: No  . Sexual Activity: Not Asked   Other Topics Concern  . None   Social History Narrative   Risk to Self: Is patient at risk for suicide?: Yes Risk to Others:   Prior Inpatient Therapy:   Prior Outpatient Therapy:    Level of Care:  OP  Hospital Course:  Katie Simon was admitted for Brief psychotic disorder and crisis management.  She was treated discharged with the medications listed below under Medication List.  Medical problems were identified and treated as needed.  Home medications were restarted as appropriate.  Improvement was monitored by observation and Katie Simon daily report of symptom reduction.  Emotional and mental status was monitored by daily self-inventory reports completed by Katie Simon and clinical staff.         Katie Simon was evaluated by the treatment team for stability and plans for continued recovery upon discharge.  Katie Simon motivation was an integral factor for scheduling further treatment.  Employment, transportation, bed availability, health status, family support, and any pending legal issues were also considered during her hospital stay.  She was offered further treatment options upon discharge including but not limited to  Residential, Intensive Outpatient, and Outpatient treatment.  Katie Simon will follow up with the services as listed below under Follow Up Information.     Upon completion of this admission the patient was both mentally and medically stable for discharge denying suicidal/homicidal ideation, auditory/visual/tactile hallucinations, delusional thoughts and paranoia.      Consults:  psychiatry  Significant Diagnostic Studies:  labs: per ED  Discharge Vitals:   Blood pressure 125/81, pulse 72,  temperature 98.2 F (36.8 C), temperature source Oral, resp. rate 16, height _0  (1.499 m), weight 77.565 kg (171 lb). Body mass index is 34.52 kg/(m^2). Lab Results:   Results for orders placed or performed during the hospital encounter of 07/14/15 (from the past 72 hour(s))  Comprehensive metabolic panel     Status: Abnormal   Collection Time: 07/14/15  3:26 PM  Result Value Ref Range   Sodium 140 135 - 145 mmol/L   Potassium 3.5 3.5 - 5.1 mmol/L   Chloride 106 101 - 111 mmol/L   CO2 26 22 - 32 mmol/L   Glucose, Bld 104 (H) 65 - 99 mg/dL   BUN 9 6 - 20 mg/dL   Creatinine, Ser 0.90 0.44 - 1.00 mg/dL   Calcium 9.6 8.9 - 10.3 mg/dL   Total Protein 8.6 (H) 6.5 - 8.1 g/dL   Albumin 4.6 3.5 - 5.0 g/dL   AST 29 15 - 41 U/L   ALT 18 14 - 54 U/L   Alkaline Phosphatase 77 38 - 126 U/L   Total Bilirubin 0.3 0.3 - 1.2 mg/dL   GFR calc non Af Amer >60 >60 mL/min   GFR calc Af Amer >60 >60 mL/min    Comment: (NOTE) The eGFR has been calculated using the CKD EPI equation. This calculation has not been validated in all clinical situations. eGFR's persistently <60 mL/min signify possible Chronic Kidney Disease.    Anion gap 8 5 - 15  Ethanol (ETOH)     Status: None   Collection Time: 07/14/15  3:26 PM  Result Value Ref Range   Alcohol, Ethyl (B) <5 <5 mg/dL    Comment:        LOWEST DETECTABLE LIMIT FOR SERUM ALCOHOL IS 5 mg/dL FOR MEDICAL PURPOSES ONLY   CBC     Status: Abnormal   Collection Time: 07/14/15  3:26 PM  Result Value Ref Range   WBC 6.4 4.0 - 10.5 K/uL   RBC 5.15 (H) 3.87 - 5.11 MIL/uL   Hemoglobin 14.4 12.0 - 15.0 g/dL   HCT 43.2 36.0 - 46.0 %   MCV 83.9 78.0 - 100.0 fL   MCH 28.0 26.0 - 34.0 pg   MCHC 33.3 30.0 - 36.0 g/dL   RDW 14.2 11.5 - 15.5 %   Platelets 293 150 - 400 K/uL  Urine rapid drug screen (hosp performed) (Not at [facility])     Status: None   Collection Time: 07/14/15  3:30 PM  Result Value Ref Range   Opiates NONE DETECTED NONE DETECTED   Cocaine  NONE DETECTED NONE DETECTED   Benzodiazepines NONE DETECTED NONE DETECTED   Amphetamines NONE DETECTED NONE DETECTED   Tetrahydrocannabinol NONE DETECTED NONE DETECTED   Barbiturates NONE DETECTED NONE DETECTED    Comment:        DRUG SCREEN FOR MEDICAL PURPOSES ONLY.  IF CONFIRMATION IS NEEDED FOR ANY PURPOSE, NOTIFY LAB WITHIN 5 DAYS.        LOWEST DETECTABLE LIMITS FOR URINE DRUG SCREEN Drug Class  Cutoff (ng/mL) Amphetamine      1000 Barbiturate      200 Benzodiazepine   500 Tricyclics       938 Opiates          300 Cocaine          300 THC              50     Physical Findings: AIMS: Facial and Oral Movements Muscles of Facial Expression: None, normal Lips and Perioral Area: None, normal Jaw: None, normal Tongue: None, normal,Extremity Movements Upper (arms, wrists, hands, fingers): None, normal Lower (legs, knees, ankles, toes): None, normal, Trunk Movements Neck, shoulders, hips: None, normal, Overall Severity Severity of abnormal movements (highest score from questions above): None, normal Incapacitation due to abnormal movements: None, normal Patient's awareness of abnormal movements (rate only patient's report): No Awareness, Dental Status Current problems with teeth and/or dentures?: No Does patient usually wear dentures?: No  CIWA:    COWS:      See Psychiatric Specialty Exam and Suicide Risk Assessment completed by Attending Physician prior to discharge.  Discharge destination:  Home  Is patient on multiple antipsychotic therapies at discharge:  No   Has Patient had three or more failed trials of antipsychotic monotherapy by history:  No    Recommended Plan for Multiple Antipsychotic Therapies: NA     Medication List    STOP taking these medications        FISH OIL PO      TAKE these medications      Indication   hydrochlorothiazide 25 MG tablet  Commonly known as:  HYDRODIURIL  Take 1 tablet (25 mg total) by mouth daily.    Indication:  High Blood Pressure     hydrOXYzine 25 MG tablet  Commonly known as:  ATARAX/VISTARIL  Take 1 tablet (25 mg total) by mouth every 6 (six) hours as needed for anxiety.   Indication:  Anxiety Neurosis     traZODone 50 MG tablet  Commonly known as:  DESYREL  Take 1 tablet (50 mg total) by mouth at bedtime as needed and may repeat dose one time if needed for sleep.   Indication:  Trouble Sleeping           Follow-up Information    Follow up with [facility].   Contact information:   [place] [id number]      Follow-up recommendations:  Activity:  as tol Diet:  as tol  Comments:  1.  Take all your medications as prescribed.              2.  Report any adverse side effects to outpatient provider.                       3.  Patient instructed to not use alcohol or illegal drugs while on prescription medicines.            4.  In the event of worsening symptoms, instructed patient to call 911, the crisis hotline or go to nearest emergency room for evaluation of symptoms.  Total Discharge Time: 30 min  Signed: Freda Munro May Erice Ahles AGNP-BC 07/17/2015, 9:39 AM

## 2015-07-17 NOTE — Progress Notes (Signed)
D: Pt has appropriate affect and pleasant mood.  She reports her day has been "awesome, I'm just feeling a whole lot better."  Pt reports her goal tonight is to "get a good nights sleep."  Pt reports she may be discharging tomorrow and that she feels safe to do so.  She reports her aftercare plans include attending counseling.  Pt denies SI/HI, denies hallucinations, denies pain.  She reports she had a good visit with her family tonight.  Pt has been visible in milieu interacting with peers and staff appropriately.  Pt attended evening group.   A: Introduced self to pt.  Met with pt 1:1 and provided support and encouragement.  Actively listened to pt.   R: Pt verbally contracts for safety and reports that she will notify staff of needs and concerns.  Will continue to monitor and assess.

## 2015-07-17 NOTE — Progress Notes (Signed)
Patient up and visible this morning. Appropriate affect, mood pleasant. States she is discharging today and feels comfortable with plan. Denies any AVH. No pain or physical complaints voiced. Rating depression, hopelessness and anxiety all at a 0/10. Patient given support and praised for progress. Medicated per orders. VSS. Patient states her children will come pick her up once her paper work is ready. She denies SI/HI and remains safe. Awaiting discharge. Katie Simon

## 2015-08-05 NOTE — Clinical Social Work Note (Signed)
Patient has care coordinator - Langston Masker Morehouse, LCSW Clinical Social Worker

## 2016-06-16 ENCOUNTER — Other Ambulatory Visit: Payer: Self-pay | Admitting: Primary Care

## 2016-06-16 DIAGNOSIS — Z1231 Encounter for screening mammogram for malignant neoplasm of breast: Secondary | ICD-10-CM

## 2016-06-21 ENCOUNTER — Ambulatory Visit
Admission: RE | Admit: 2016-06-21 | Discharge: 2016-06-21 | Disposition: A | Discharge status: Home / Self Care | Payer: No Typology Code available for payment source | Source: Ambulatory Visit | Attending: Primary Care | Admitting: Primary Care

## 2016-06-21 DIAGNOSIS — Z1231 Encounter for screening mammogram for malignant neoplasm of breast: Secondary | ICD-10-CM

## 2016-08-11 ENCOUNTER — Encounter (HOSPITAL_COMMUNITY): Payer: Self-pay

## 2016-08-11 ENCOUNTER — Emergency Department (HOSPITAL_COMMUNITY): Payer: Self-pay

## 2016-08-11 ENCOUNTER — Emergency Department (HOSPITAL_COMMUNITY)
Admission: EM | Admit: 2016-08-11 | Discharge: 2016-08-12 | Disposition: A | Discharge status: Home / Self Care | Payer: Self-pay | Attending: Emergency Medicine | Admitting: Emergency Medicine

## 2016-08-11 DIAGNOSIS — Y999 Unspecified external cause status: Secondary | ICD-10-CM | POA: Insufficient documentation

## 2016-08-11 DIAGNOSIS — M542 Cervicalgia: Secondary | ICD-10-CM | POA: Insufficient documentation

## 2016-08-11 DIAGNOSIS — F419 Anxiety disorder, unspecified: Secondary | ICD-10-CM | POA: Insufficient documentation

## 2016-08-11 DIAGNOSIS — I10 Essential (primary) hypertension: Secondary | ICD-10-CM | POA: Insufficient documentation

## 2016-08-11 DIAGNOSIS — R51 Headache: Secondary | ICD-10-CM | POA: Insufficient documentation

## 2016-08-11 DIAGNOSIS — R55 Syncope and collapse: Secondary | ICD-10-CM | POA: Insufficient documentation

## 2016-08-11 DIAGNOSIS — Y9301 Activity, walking, marching and hiking: Secondary | ICD-10-CM | POA: Insufficient documentation

## 2016-08-11 DIAGNOSIS — M549 Dorsalgia, unspecified: Secondary | ICD-10-CM | POA: Insufficient documentation

## 2016-08-11 DIAGNOSIS — R443 Hallucinations, unspecified: Secondary | ICD-10-CM | POA: Insufficient documentation

## 2016-08-11 DIAGNOSIS — S60512A Abrasion of left hand, initial encounter: Secondary | ICD-10-CM | POA: Insufficient documentation

## 2016-08-11 DIAGNOSIS — E119 Type 2 diabetes mellitus without complications: Secondary | ICD-10-CM | POA: Insufficient documentation

## 2016-08-11 DIAGNOSIS — Z5181 Encounter for therapeutic drug level monitoring: Secondary | ICD-10-CM | POA: Insufficient documentation

## 2016-08-11 DIAGNOSIS — W1830XA Fall on same level, unspecified, initial encounter: Secondary | ICD-10-CM | POA: Insufficient documentation

## 2016-08-11 DIAGNOSIS — Y929 Unspecified place or not applicable: Secondary | ICD-10-CM | POA: Insufficient documentation

## 2016-08-11 HISTORY — DX: Post-traumatic stress disorder, unspecified: F43.10

## 2016-08-11 LAB — CBC WITH DIFFERENTIAL/PLATELET
BASOS ABS: 0 10*3/uL (ref 0.0–0.1)
BASOS PCT: 0 %
EOS ABS: 0.1 10*3/uL (ref 0.0–0.7)
Eosinophils Relative: 1 %
HCT: 42.1 % (ref 36.0–46.0)
Hemoglobin: 13.8 g/dL (ref 12.0–15.0)
LYMPHS PCT: 26 %
Lymphs Abs: 1.8 10*3/uL (ref 0.7–4.0)
MCH: 27.9 pg (ref 26.0–34.0)
MCHC: 32.8 g/dL (ref 30.0–36.0)
MCV: 85.1 fL (ref 78.0–100.0)
Monocytes Absolute: 0.3 10*3/uL (ref 0.1–1.0)
Monocytes Relative: 5 %
NEUTROS ABS: 4.6 10*3/uL (ref 1.7–7.7)
NEUTROS PCT: 68 %
PLATELETS: 293 10*3/uL (ref 150–400)
RBC: 4.95 MIL/uL (ref 3.87–5.11)
RDW: 14.3 % (ref 11.5–15.5)
WBC: 6.8 10*3/uL (ref 4.0–10.5)

## 2016-08-11 LAB — ETHANOL

## 2016-08-11 LAB — RAPID URINE DRUG SCREEN, HOSP PERFORMED
Amphetamines: NOT DETECTED
Barbiturates: NOT DETECTED
Benzodiazepines: NOT DETECTED
Cocaine: NOT DETECTED
OPIATES: NOT DETECTED
TETRAHYDROCANNABINOL: NOT DETECTED

## 2016-08-11 LAB — ACETAMINOPHEN LEVEL: Acetaminophen (Tylenol), Serum: <10 ug/mL — ABNORMAL LOW (ref 10–30)

## 2016-08-11 LAB — COMPREHENSIVE METABOLIC PANEL
ALBUMIN: 4.1 g/dL (ref 3.5–5.0)
ALT: 15 U/L (ref 14–54)
ANION GAP: 7 (ref 5–15)
AST: 22 U/L (ref 15–41)
Alkaline Phosphatase: 78 U/L (ref 38–126)
BILIRUBIN TOTAL: 0.3 mg/dL (ref 0.3–1.2)
BUN: 9 mg/dL (ref 6–20)
CHLORIDE: 108 mmol/L (ref 101–111)
CO2: 26 mmol/L (ref 22–32)
Calcium: 9.5 mg/dL (ref 8.9–10.3)
Creatinine, Ser: 0.85 mg/dL (ref 0.44–1.00)
GFR calc Af Amer: >60 mL/min (ref >60)
GLUCOSE: 114 mg/dL — AB (ref 65–99)
POTASSIUM: 3.5 mmol/L (ref 3.5–5.1)
Sodium: 141 mmol/L (ref 135–145)
TOTAL PROTEIN: 8.1 g/dL (ref 6.5–8.1)

## 2016-08-11 LAB — SALICYLATE LEVEL

## 2016-08-11 MED ORDER — TRAZODONE HCL 50 MG PO TABS: 50.0000 mg | ORAL_TABLET | Freq: Every evening | ORAL | Status: DC | PRN

## 2016-08-11 MED ORDER — HYDROXYZINE HCL 25 MG PO TABS: 25.0000 mg | ORAL_TABLET | Freq: Four times a day (QID) | ORAL | Status: DC | PRN

## 2016-08-11 MED ORDER — HYDROCHLOROTHIAZIDE 25 MG PO TABS
25.0000 mg | ORAL_TABLET | Freq: Every day | ORAL | Status: DC
  Administered 2016-08-11: 25 mg via ORAL
  Filled 2016-08-11 (×2): qty 1
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
## 2016-08-11 NOTE — ED Notes (Signed)
Patient transported to CT 
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
## 2016-08-11 NOTE — ED Notes (Signed)
Bed: WA06 Expected date:  Expected time:  Means of arrival:  Comments: EMS 60 yo - syncope
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
## 2016-08-11 NOTE — ED Notes (Signed)
Pt cannot use restroom at this time, aware urine specimen is needed.  

## 2017-05-26 ENCOUNTER — Other Ambulatory Visit: Payer: Self-pay | Admitting: *Deleted

## 2017-05-26 DIAGNOSIS — Z1231 Encounter for screening mammogram for malignant neoplasm of breast: Secondary | ICD-10-CM

## 2017-06-28 ENCOUNTER — Other Ambulatory Visit: Payer: Self-pay | Admitting: *Deleted

## 2017-06-28 DIAGNOSIS — Z1231 Encounter for screening mammogram for malignant neoplasm of breast: Secondary | ICD-10-CM

## 2017-07-01 ENCOUNTER — Ambulatory Visit
Admission: RE | Admit: 2017-07-01 | Discharge: 2017-07-01 | Disposition: A | Discharge status: Home / Self Care | Payer: No Typology Code available for payment source | Source: Ambulatory Visit | Attending: *Deleted | Admitting: *Deleted

## 2017-07-01 DIAGNOSIS — Z1231 Encounter for screening mammogram for malignant neoplasm of breast: Secondary | ICD-10-CM

## 2017-08-24 ENCOUNTER — Emergency Department (HOSPITAL_BASED_OUTPATIENT_CLINIC_OR_DEPARTMENT_OTHER)
Admission: EM | Admit: 2017-08-24 | Discharge: 2017-08-24 | Disposition: A | Discharge status: Home / Self Care | Payer: Self-pay | Attending: Emergency Medicine | Admitting: Emergency Medicine

## 2017-08-24 ENCOUNTER — Encounter (HOSPITAL_COMMUNITY): Payer: Self-pay | Admitting: Emergency Medicine

## 2017-08-24 ENCOUNTER — Emergency Department (HOSPITAL_BASED_OUTPATIENT_CLINIC_OR_DEPARTMENT_OTHER): Payer: Self-pay

## 2017-08-24 ENCOUNTER — Emergency Department (HOSPITAL_COMMUNITY)
Admission: EM | Admit: 2017-08-24 | Discharge: 2017-08-24 | Discharge status: Against Medical Advice | Payer: No Typology Code available for payment source | Attending: Emergency Medicine | Admitting: Emergency Medicine

## 2017-08-24 ENCOUNTER — Encounter (HOSPITAL_BASED_OUTPATIENT_CLINIC_OR_DEPARTMENT_OTHER): Payer: Self-pay

## 2017-08-24 DIAGNOSIS — S0990XA Unspecified injury of head, initial encounter: Secondary | ICD-10-CM

## 2017-08-24 DIAGNOSIS — E119 Type 2 diabetes mellitus without complications: Secondary | ICD-10-CM | POA: Insufficient documentation

## 2017-08-24 DIAGNOSIS — Y999 Unspecified external cause status: Secondary | ICD-10-CM | POA: Insufficient documentation

## 2017-08-24 DIAGNOSIS — Y9301 Activity, walking, marching and hiking: Secondary | ICD-10-CM | POA: Insufficient documentation

## 2017-08-24 DIAGNOSIS — S0101XA Laceration without foreign body of scalp, initial encounter: Secondary | ICD-10-CM | POA: Insufficient documentation

## 2017-08-24 DIAGNOSIS — Z23 Encounter for immunization: Secondary | ICD-10-CM | POA: Insufficient documentation

## 2017-08-24 DIAGNOSIS — W228XXA Striking against or struck by other objects, initial encounter: Secondary | ICD-10-CM | POA: Insufficient documentation

## 2017-08-24 DIAGNOSIS — I10 Essential (primary) hypertension: Secondary | ICD-10-CM | POA: Insufficient documentation

## 2017-08-24 DIAGNOSIS — Z5321 Procedure and treatment not carried out due to patient leaving prior to being seen by health care provider: Secondary | ICD-10-CM | POA: Insufficient documentation

## 2017-08-24 DIAGNOSIS — Y929 Unspecified place or not applicable: Secondary | ICD-10-CM | POA: Insufficient documentation

## 2017-08-24 MED ORDER — TETANUS-DIPHTH-ACELL PERTUSSIS 5-2.5-18.5 LF-MCG/0.5 IM SUSP
0.5000 mL | Freq: Once | INTRAMUSCULAR | Status: AC
  Administered 2017-08-24: 0.5 mL via INTRAMUSCULAR
  Filled 2017-08-24: qty 0.5

## 2017-08-24 NOTE — ED Provider Notes (Signed)
[facility] EMERGENCY DEPARTMENT Provider Note   CSN: [id number] Arrival date & time: 08/24/17  1659     History   Chief Complaint Chief Complaint  Patient presents with  . Head Laceration    HPI Katie Simon is a 61 y.o. female.  HPI Katie Simon is a 61 y.o. female presents to emergency department complaining of a head injury.  Patient states she got up from picking something off the floor yesterday and hit right side of the head on the cabinet.  No LOC. She reports small laceration to the head that was bleeding yesterday, bleeding improved today.  She states that she had mild headache yesterday, this morning woke up with worse headache of her life.  States headache is "all over my head."  She reports also pain behind right ear and down to the neck.  She denies any changes in vision.  No nausea or vomiting.  She had some dizziness yesterday which improved today.  No numbness or weakness in extremities.  No difficulty with memory.  No difficulty with speech or walking.  Took some Tylenol yesterday, no medications taken today. Tdap greater than 10 years ago  Past Medical History:  Diagnosis Date  . Anemia   . Anxiety   . Depression   . Diabetes mellitus without complication ([place])   . Hypertension   . PTSD (post-traumatic stress disorder)     Patient Active Problem List   Diagnosis Date Noted  . Brief psychotic disorder ([place]) 07/16/2015  . PTSD (post-traumatic stress disorder) 07/15/2015  . Psychosis ([place]) 07/15/2015    Past Surgical History:  Procedure Laterality Date  . ABDOMINAL HYSTERECTOMY    . TUBAL LIGATION      OB History    No data available       Home Medications    Prior to Admission medications   Medication Sig Start Date End Date Taking? Authorizing Provider  hydrochlorothiazide (HYDRODIURIL) 25 MG tablet Take 1 tablet (25 mg total) by mouth daily. Patient not taking: Reported on 08/11/2016 07/17/15   Kerrie Buffalo, NP     Family History Family History  Problem Relation Age of Onset  . Diabetes Mother   . Cancer Mother   . Aneurysm Mother   . Diabetes Father   . Cancer Father   . Cancer Sister   . Cancer Brother     Social History Social History  Substance Use Topics  . Smoking status: Never Smoker  . Smokeless tobacco: Never Used  . Alcohol use No     Allergies   Patient has no known allergies.   Review of Systems Review of Systems  Constitutional: Negative for chills and fever.  Respiratory: Negative for cough, chest tightness and shortness of breath.   Cardiovascular: Negative for chest pain, palpitations and leg swelling.  Gastrointestinal: Negative for abdominal pain, diarrhea, nausea and vomiting.  Musculoskeletal: Negative for arthralgias, myalgias, neck pain and neck stiffness.  Skin: Positive for wound. Negative for rash.  Neurological: Positive for headaches. Negative for dizziness, weakness and numbness.  All other systems reviewed and are negative.    Physical Exam Updated Vital Signs BP (!) 155/77 (BP Location: Left Arm)   Pulse 73   Temp 98.4 F (36.9 C) (Oral)   Resp 20   Ht 4\' 11"  (1.499 m)   Wt 85.4 kg (188 lb 4.4 oz)   SpO2 98%   BMI 38.03 kg/m   Physical Exam  Constitutional: She is oriented to person, place, and time.  She appears well-developed and well-nourished. No distress.  HENT:  Head: Normocephalic.  1cm superficial non gaping laceration to the right scalp, just above the hair line.   Eyes: Pupils are equal, round, and reactive to light. Conjunctivae and EOM are normal.  Neck: Normal range of motion. Neck supple.  No meningismus  Cardiovascular: Normal rate, regular rhythm and normal heart sounds.   Pulmonary/Chest: Effort normal and breath sounds normal. No respiratory distress. She has no wheezes. She has no rales.  Abdominal: Soft. Bowel sounds are normal. She exhibits no distension. There is no tenderness. There is no rebound.   Musculoskeletal: She exhibits no edema.  Neurological: She is alert and oriented to person, place, and time. No cranial nerve deficit. She exhibits normal muscle tone. Coordination normal.  5/5 and equal upper and lower extremity strength bilaterally. Equal grip strength bilaterally. Normal finger to nose and heel to shin. No pronator drift.   Skin: Skin is warm and dry. No rash noted.  Psychiatric: She has a normal mood and affect. Her behavior is normal.  Nursing note and vitals reviewed.    ED Treatments / Results  Labs (all labs ordered are listed, but only abnormal results are displayed) Labs Reviewed - No data to display  EKG  EKG Interpretation None       Radiology No results found.  Procedures Procedures (including critical care time)  Medications Ordered in ED Medications  Tdap (BOOSTRIX) injection 0.5 mL (not administered)     Initial Impression / Assessment and Plan / ED Course  I have reviewed the triage vital signs and the nursing notes.  Pertinent labs & imaging results that were available during my care of the patient were reviewed by me and considered in my medical decision making (see chart for details).    Pt in ED with head injury which occurred last night, today severe headache.  Neuro exam is normal.  She is not on any blood thinners.  Given severe headache, will get CT scan to rule out intracranial bleeding.  I do not think the laceration needs any repair.  Will update tetanus.  Bacitracin to the laceration twice a day.  6:33 PM CT scan is negative.  Plan to discharge home, Tylenol or Motrin for headache.  Advised to apply ice pack to the injured area.  Continue bacitracin.  Follow-up as needed.  Return precautions discussed.  Vitals:   08/24/17 1706 08/24/17 1708  BP:  (!) 155/77  Pulse:  73  Resp:  20  Temp:  98.4 F (36.9 C)  TempSrc:  Oral  SpO2:  98%  Weight: 85.4 kg (188 lb 4.4 oz)   Height: 4\' 11"  (1.499 m)       Final Clinical  Impressions(s) / ED Diagnoses   Final diagnoses:  Laceration of scalp, initial encounter  Injury of head, initial encounter    New Prescriptions New Prescriptions   No medications on file     Jeannett Senior, Hershal Coria 08/24/17 [place], Gwenyth Allegra, MD 08/24/17 2007

## 2017-08-24 NOTE — Discharge Instructions (Signed)
Tylenol or motrin for headache. Continue to apply triple antibiotic ointment to the laceration. Follow up with family doctor as needed. Return if worsening headache, vomiting, blurred vision, any new concerning symptoms.

## 2017-08-24 NOTE — ED Triage Notes (Signed)
Pt hit head on cabinet while standing up yesterday, R anterior head by hairline. Pt's family cleaned wound and no bleeding upon arrival to triage. Pt c/o headache and states she attempted advil last evening but pain remained the same today. No LOC.

## 2017-08-24 NOTE — ED Triage Notes (Signed)
Pt states she struck head on cabinet yesterday-no LOC-slight lac noted-no bleeding-c/o HA-NAD-steady gait

## 2017-08-24 NOTE — ED Notes (Signed)
pateint ambulatory with out assistance with steady gait back to FT room

## 2021-03-12 ENCOUNTER — Encounter: Payer: Self-pay | Admitting: Family

## 2021-03-12 ENCOUNTER — Other Ambulatory Visit: Payer: Self-pay

## 2021-03-12 ENCOUNTER — Ambulatory Visit (INDEPENDENT_AMBULATORY_CARE_PROVIDER_SITE_OTHER): Payer: Medicare Other | Admitting: Family

## 2021-03-12 VITALS — BP 130/80 | HR 80 | Temp 98.5°F | Ht 59.0 in | Wt 188.0 lb

## 2021-03-12 DIAGNOSIS — Z1159 Encounter for screening for other viral diseases: Secondary | ICD-10-CM

## 2021-03-12 DIAGNOSIS — Z1211 Encounter for screening for malignant neoplasm of colon: Secondary | ICD-10-CM

## 2021-03-12 DIAGNOSIS — Z23 Encounter for immunization: Secondary | ICD-10-CM

## 2021-03-12 DIAGNOSIS — Z1231 Encounter for screening mammogram for malignant neoplasm of breast: Secondary | ICD-10-CM

## 2021-03-12 DIAGNOSIS — Z13220 Encounter for screening for lipoid disorders: Secondary | ICD-10-CM | POA: Diagnosis not present

## 2021-03-12 DIAGNOSIS — E2839 Other primary ovarian failure: Secondary | ICD-10-CM | POA: Diagnosis not present

## 2021-03-12 DIAGNOSIS — Z0000 Encounter for general adult medical examination without abnormal findings: Secondary | ICD-10-CM

## 2021-03-12 LAB — COMPREHENSIVE METABOLIC PANEL
ALT: 17 U/L (ref 0–35)
AST: 32 U/L (ref 0–37)
Albumin: 4.4 g/dL (ref 3.5–5.2)
Alkaline Phosphatase: 79 U/L (ref 39–117)
BUN: 9 mg/dL (ref 6–23)
CO2: 30 mEq/L (ref 19–32)
Calcium: 9.3 mg/dL (ref 8.4–10.5)
Chloride: 103 mEq/L (ref 96–112)
Creatinine, Ser: 0.77 mg/dL (ref 0.40–1.20)
GFR: 80.69 mL/min (ref >60.00)
Glucose, Bld: 108 mg/dL — ABNORMAL HIGH (ref 70–99)
Potassium: 5.3 mEq/L — ABNORMAL HIGH (ref 3.5–5.1)
Sodium: 139 mEq/L (ref 135–145)
Total Bilirubin: 0.5 mg/dL (ref 0.2–1.2)
Total Protein: 7.8 g/dL (ref 6.0–8.3)

## 2021-03-12 LAB — LIPID PANEL
Cholesterol: 205 mg/dL — ABNORMAL HIGH (ref 0–200)
HDL: 53.9 mg/dL (ref >39.00)
LDL Cholesterol: 127 mg/dL — ABNORMAL HIGH (ref 0–99)
NonHDL: 150.73
Total CHOL/HDL Ratio: 4
Triglycerides: 120 mg/dL (ref 0.0–149.0)
VLDL: 24 mg/dL (ref 0.0–40.0)

## 2021-03-12 LAB — CBC WITH DIFFERENTIAL/PLATELET
Basophils Absolute: 0.1 10*3/uL (ref 0.0–0.1)
Basophils Relative: 1 % (ref 0.0–3.0)
Eosinophils Absolute: 0.2 10*3/uL (ref 0.0–0.7)
Eosinophils Relative: 3.7 % (ref 0.0–5.0)
HCT: 41.3 % (ref 36.0–46.0)
Hemoglobin: 13.5 g/dL (ref 12.0–15.0)
Lymphocytes Relative: 46.8 % — ABNORMAL HIGH (ref 12.0–46.0)
Lymphs Abs: 2.5 10*3/uL (ref 0.7–4.0)
MCHC: 32.6 g/dL (ref 30.0–36.0)
MCV: 83.7 fl (ref 78.0–100.0)
Monocytes Absolute: 0.3 10*3/uL (ref 0.1–1.0)
Monocytes Relative: 5.1 % (ref 3.0–12.0)
Neutro Abs: 2.4 10*3/uL (ref 1.4–7.7)
Neutrophils Relative %: 43.4 % (ref 43.0–77.0)
Platelets: 266 10*3/uL (ref 150.0–400.0)
RBC: 4.94 Mil/uL (ref 3.87–5.11)
RDW: 14.5 % (ref 11.5–15.5)
WBC: 5.4 10*3/uL (ref 4.0–10.5)

## 2021-03-12 LAB — TSH: TSH: 2.44 u[IU]/mL (ref 0.35–4.50)

## 2021-03-12 NOTE — Progress Notes (Signed)
Katie Simon is a 65 y.o. female with the following history as recorded in EpicCare:  Patient Active Problem List   Diagnosis Date Noted  . Brief psychotic disorder ([place]) 07/16/2015  . PTSD (post-traumatic stress disorder) 07/15/2015  . Psychosis ([place]) 07/15/2015    Current Outpatient Medications  Medication Sig Dispense Refill  . Cholecalciferol 25 MCG (1000 UT) capsule Take by mouth.    . Multiple Vitamins-Minerals (EMERGEN-C VITAMIN C) PACK Take by mouth.     No current facility-administered medications for this visit.    Allergies: Patient has no known allergies.  Past Medical History:  Diagnosis Date  . Anemia   . Anxiety   . Depression   . Diabetes mellitus without complication ([place])   . Hypertension   . PTSD (post-traumatic stress disorder)     Past Surgical History:  Procedure Laterality Date  . ABDOMINAL HYSTERECTOMY    . TUBAL LIGATION      Family History  Problem Relation Age of Onset  . Diabetes Mother   . Cancer Mother   . Aneurysm Mother   . Diabetes Father   . Cancer Father   . Kidney disease Father   . Stroke Father   . Cancer Sister   . Cancer Brother   . Diabetes Brother   . Kidney disease Brother   . Diabetes Brother   . Early death Brother     Social History   Tobacco Use  . Smoking status: Never Smoker  . Smokeless tobacco: Never Used  Substance Use Topics  . Alcohol use: No    Subjective:   Presents today as a new patient; needs to get yearly CPE; has not seen PCP in a number of years due to lack of health insurance;  History of HTN- has taken HCTZ in the past; not on medication in almost 2 years; was able to make dietary changes to help manage; Denies any chest pain, shortness of breath, blurred vision or headache  Review of Systems  Constitutional: Negative.   HENT: Negative.   Eyes: Negative.   Respiratory: Negative.   Cardiovascular: Negative.   Gastrointestinal: Negative.   Genitourinary: Negative.   Musculoskeletal:  Negative.   Skin: Negative.   Neurological: Negative.   Endo/Heme/Allergies: Negative.   Psychiatric/Behavioral: Negative.       Objective:  Vitals:   03/12/21 1013  BP: 130/80  Pulse: 80  Temp: 98.5 F (36.9 C)  TempSrc: Oral  SpO2: 94%  Weight: 188 lb (85.3 kg)  Height: $Remove'4\' 11"'vUbipMq$  (1.499 m)    General: Well developed, well nourished, in no acute distress  Skin : Warm and dry.  Head: Normocephalic and atraumatic  Eyes: Sclera and conjunctiva clear; pupils round and reactive to light; extraocular movements intact  Ears: External normal; canals clear; tympanic membranes normal  Oropharynx: Pink, supple. No suspicious lesions  Neck: Supple without thyromegaly, adenopathy  Lungs: Respirations unlabored; clear to auscultation bilaterally without wheeze, rales, rhonchi  CVS exam: normal rate and regular rhythm.  Abdomen: Soft; nontender; nondistended; normoactive bowel sounds; no masses or hepatosplenomegaly  Musculoskeletal: No deformities; no active joint inflammation  Extremities: No edema, cyanosis, clubbing  Vessels: Symmetric bilaterally  Neurologic: Alert and oriented; speech intact; face symmetrical; moves all extremities well; CNII-XII intact without focal deficit   Assessment:  1. PE (physical exam), annual   2. Lipid screening   3. Visit for screening mammogram   4. Ovarian failure   5. Encounter for screening colonoscopy   6. Need for hepatitis C  screening test   7. Need for vaccination     Plan:  Age appropriate preventive healthcare needs addressed; encouraged regular eye doctor and dental exams; encouraged regular exercise; will update labs and refills as needed today; follow-up to be determined; Prevnar 20 given today; plan for Prevnar next year; she will get [place] a copy of her COVID vaccine card. She will plan to get COVID booster in 2-3 weeks at pharmacy downstairs and will try to schedule her DEXA/ mammogram at the same time;  Follow- up to be determined based  on labs;  This visit occurred during the SARS-CoV-2 public health emergency.  Safety protocols were in place, including screening questions prior to the visit, additional usage of staff PPE, and extensive cleaning of exam room while observing appropriate contact time as indicated for disinfecting solutions.     No follow-ups on file.  Orders Placed This Encounter  Procedures  . MM 3D SCREEN BREAST BILATERAL    Standing Status:   Future    Standing Expiration Date:   03/12/2022    Scheduling Instructions:     Please schedule with DEXA    Order Specific Question:   Reason for Exam (SYMPTOM  OR DIAGNOSIS REQUIRED)    Answer:   screening mammogram    Order Specific Question:   Preferred imaging location?    Answer:   [facility]  . DG Bone Density    Standing Status:   Future    Standing Expiration Date:   03/12/2022    Scheduling Instructions:     Schedule with screening mammogram    Order Specific Question:   Reason for Exam (SYMPTOM  OR DIAGNOSIS REQUIRED)    Answer:   ovarian failure    Order Specific Question:   Preferred imaging location?    Answer:   [facility]  . Pneumococcal conjugate vaccine 20-valent (Prevnar 20)  . CBC with Differential/Platelet  . Comp Met (CMET)  . Lipid panel  . TSH  . Hepatitis C Antibody  . Ambulatory referral to Gastroenterology    Referral Priority:   Routine    Referral Type:   Consultation    Referral Reason:   Specialty Services Required    Number of Visits Requested:   1    Requested Prescriptions    No prescriptions requested or ordered in this encounter

## 2021-03-13 ENCOUNTER — Other Ambulatory Visit: Payer: Self-pay | Admitting: Family

## 2021-03-13 DIAGNOSIS — E875 Hyperkalemia: Secondary | ICD-10-CM

## 2021-03-13 LAB — HEPATITIS C ANTIBODY
Hepatitis C Ab: NONREACTIVE
SIGNAL TO CUT-OFF: 0.01 (ref <1.00)

## 2021-03-17 ENCOUNTER — Other Ambulatory Visit: Payer: Self-pay

## 2021-03-17 ENCOUNTER — Other Ambulatory Visit (INDEPENDENT_AMBULATORY_CARE_PROVIDER_SITE_OTHER): Payer: Medicare Other

## 2021-03-17 DIAGNOSIS — E875 Hyperkalemia: Secondary | ICD-10-CM | POA: Diagnosis not present

## 2021-03-18 ENCOUNTER — Encounter: Payer: Self-pay | Admitting: Family

## 2021-03-18 LAB — BASIC METABOLIC PANEL
BUN: 8 mg/dL (ref 6–23)
CO2: 28 mEq/L (ref 19–32)
Calcium: 9.4 mg/dL (ref 8.4–10.5)
Chloride: 104 mEq/L (ref 96–112)
Creatinine, Ser: 0.84 mg/dL (ref 0.40–1.20)
GFR: 72.68 mL/min (ref >60.00)
Glucose, Bld: 136 mg/dL — ABNORMAL HIGH (ref 70–99)
Potassium: 4.1 mEq/L (ref 3.5–5.1)
Sodium: 141 mEq/L (ref 135–145)

## 2021-03-19 ENCOUNTER — Other Ambulatory Visit: Payer: Self-pay | Admitting: Family

## 2021-03-19 MED ORDER — ROSUVASTATIN CALCIUM 10 MG PO TABS: 10.0000 mg | ORAL_TABLET | Freq: Every day | ORAL | 1 refills | Status: DC

## 2021-03-24 ENCOUNTER — Encounter (HOSPITAL_BASED_OUTPATIENT_CLINIC_OR_DEPARTMENT_OTHER): Payer: Self-pay

## 2021-03-24 ENCOUNTER — Ambulatory Visit (HOSPITAL_BASED_OUTPATIENT_CLINIC_OR_DEPARTMENT_OTHER)
Admission: RE | Admit: 2021-03-24 | Discharge: 2021-03-24 | Disposition: A | Discharge status: Home / Self Care | Payer: Medicare Other | Source: Ambulatory Visit | Attending: Family | Admitting: Family

## 2021-03-24 ENCOUNTER — Other Ambulatory Visit: Payer: Self-pay

## 2021-03-24 DIAGNOSIS — Z1231 Encounter for screening mammogram for malignant neoplasm of breast: Secondary | ICD-10-CM | POA: Insufficient documentation

## 2021-03-24 DIAGNOSIS — E2839 Other primary ovarian failure: Secondary | ICD-10-CM | POA: Insufficient documentation

## 2021-04-09 ENCOUNTER — Telehealth: Payer: Self-pay | Admitting: Family

## 2021-04-09 NOTE — Telephone Encounter (Signed)
I would like her to make OV in early August to follow up on her blood sugar. Can you go ahead and get this scheduled?

## 2021-04-10 NOTE — Telephone Encounter (Signed)
Patient scheduled for 06/16/21.

## 2021-06-16 ENCOUNTER — Ambulatory Visit (INDEPENDENT_AMBULATORY_CARE_PROVIDER_SITE_OTHER): Payer: Medicare Other | Admitting: Family

## 2021-06-16 ENCOUNTER — Other Ambulatory Visit: Payer: Self-pay

## 2021-06-16 ENCOUNTER — Encounter: Payer: Self-pay | Admitting: Family

## 2021-06-16 ENCOUNTER — Other Ambulatory Visit: Payer: Self-pay | Admitting: Family

## 2021-06-16 VITALS — BP 150/80 | HR 70 | Temp 98.6°F | Ht 59.0 in | Wt 184.6 lb

## 2021-06-16 DIAGNOSIS — E785 Hyperlipidemia, unspecified: Secondary | ICD-10-CM

## 2021-06-16 DIAGNOSIS — I10 Essential (primary) hypertension: Secondary | ICD-10-CM | POA: Diagnosis not present

## 2021-06-16 DIAGNOSIS — R7309 Other abnormal glucose: Secondary | ICD-10-CM

## 2021-06-16 LAB — BASIC METABOLIC PANEL
BUN: 6 mg/dL (ref 6–23)
CO2: 30 mEq/L (ref 19–32)
Calcium: 9.5 mg/dL (ref 8.4–10.5)
Chloride: 103 mEq/L (ref 96–112)
Creatinine, Ser: 0.8 mg/dL (ref 0.40–1.20)
GFR: 76.93 mL/min (ref >60.00)
Glucose, Bld: 115 mg/dL — ABNORMAL HIGH (ref 70–99)
Potassium: 4.4 mEq/L (ref 3.5–5.1)
Sodium: 140 mEq/L (ref 135–145)

## 2021-06-16 LAB — LIPID PANEL
Cholesterol: 121 mg/dL (ref 0–200)
HDL: 50.1 mg/dL (ref >39.00)
LDL Cholesterol: 51 mg/dL (ref 0–99)
NonHDL: 70.61
Total CHOL/HDL Ratio: 2
Triglycerides: 99 mg/dL (ref 0.0–149.0)
VLDL: 19.8 mg/dL (ref 0.0–40.0)

## 2021-06-16 LAB — HEMOGLOBIN A1C: Hgb A1c MFr Bld: 7 % — ABNORMAL HIGH (ref 4.6–6.5)

## 2021-06-16 MED ORDER — LOSARTAN POTASSIUM 50 MG PO TABS: 50.0000 mg | ORAL_TABLET | Freq: Every day | ORAL | 1 refills | Status: DC

## 2021-06-16 MED ORDER — METFORMIN HCL ER 500 MG PO TB24: 500.0000 mg | ORAL_TABLET | Freq: Every day | ORAL | 0 refills | Status: DC

## 2021-06-16 NOTE — Progress Notes (Signed)
Katie Simon is a 65 y.o. female with the following history as recorded in [employer]:  Patient Active Problem List   Diagnosis Date Noted   Brief psychotic disorder ([place]) 07/16/2015   PTSD (post-traumatic stress disorder) 07/15/2015   Psychosis ([place]) 07/15/2015    Current Outpatient Medications  Medication Sig Dispense Refill   Cholecalciferol 25 MCG (1000 UT) capsule Take by mouth.     losartan (COZAAR) 50 MG tablet Take 1 tablet (50 mg total) by mouth daily. 90 tablet 1   Multiple Vitamins-Minerals (EMERGEN-C VITAMIN C) PACK Take by mouth.     rosuvastatin (CRESTOR) 10 MG tablet Take 1 tablet (10 mg total) by mouth daily. 90 tablet 1   No current facility-administered medications for this visit.    Allergies: Patient has no known allergies.  Past Medical History:  Diagnosis Date   Anemia    Anxiety    Depression    Diabetes mellitus without complication ([place])    Hypertension    PTSD (post-traumatic stress disorder)     Past Surgical History:  Procedure Laterality Date   ABDOMINAL HYSTERECTOMY     TUBAL LIGATION      Family History  Problem Relation Age of Onset   Diabetes Mother    Cancer Mother    Aneurysm Mother    Diabetes Father    Cancer Father    Kidney disease Father    Stroke Father    Cancer Sister    Cancer Brother    Diabetes Brother    Kidney disease Brother    Diabetes Brother    Early death Brother     Social History   Tobacco Use   Smoking status: Never   Smokeless tobacco: Never  Substance Use Topics   Alcohol use: No    Subjective:   3 month follow up on hypertension/ hyperlipidemia/ elevated glucose; tolerating Crestor well; has taken blood pressure medication in the past- does not remember why it was stopped; Denies any chest pain, shortness of breath, blurred vision or headache    Objective:  Vitals:   06/16/21 0933 06/16/21 0939  BP: (!) 160/90 (!) 150/80  Pulse: 70   Temp: 98.6 F (37 C)   TempSrc: Oral   SpO2: 98%    Weight: 184 lb 9.6 oz (83.7 kg)   Height: '4\' 11"'$  (1.499 m)     General: Well developed, well nourished, in no acute distress  Skin : Warm and dry.  Head: Normocephalic and atraumatic  Lungs: Respirations unlabored; clear to auscultation bilaterally without wheeze, rales, rhonchi  CVS exam: normal rate and regular rhythm.  Neurologic: Alert and oriented; speech intact; face symmetrical; moves all extremities well; CNII-XII intact without focal deficit   Assessment:  1. Elevated glucose   2. Hyperlipidemia, unspecified hyperlipidemia type   3. Primary hypertension     Plan:  Check Hgba1c today; Check lipid panel; continue Crestor 10 mg daily; Start Losartan 50 mg daily; follow up in 2-3 months;  This visit occurred during the SARS-CoV-2 public health emergency.  Safety protocols were in place, including screening questions prior to the visit, additional usage of staff PPE, and extensive cleaning of exam room while observing appropriate contact time as indicated for disinfecting solutions.    No follow-ups on file.  Orders Placed This Encounter  Procedures   Basic Metabolic Panel (BMET)   Hemoglobin A1c   Lipid panel    Requested Prescriptions   Signed Prescriptions Disp Refills   losartan (COZAAR) 50 MG tablet 90  tablet 1    Sig: Take 1 tablet (50 mg total) by mouth daily.

## 2021-06-27 DIAGNOSIS — H52223 Regular astigmatism, bilateral: Secondary | ICD-10-CM | POA: Diagnosis not present

## 2021-06-27 DIAGNOSIS — H524 Presbyopia: Secondary | ICD-10-CM | POA: Diagnosis not present

## 2021-06-27 DIAGNOSIS — H5203 Hypermetropia, bilateral: Secondary | ICD-10-CM | POA: Diagnosis not present

## 2021-06-27 DIAGNOSIS — H2513 Age-related nuclear cataract, bilateral: Secondary | ICD-10-CM | POA: Diagnosis not present

## 2021-07-27 ENCOUNTER — Encounter: Payer: Self-pay | Admitting: Gastroenterology

## 2021-08-18 ENCOUNTER — Ambulatory Visit: Payer: Medicare Other | Admitting: Family

## 2021-08-25 ENCOUNTER — Ambulatory Visit (AMBULATORY_SURGERY_CENTER): Payer: Self-pay

## 2021-08-25 ENCOUNTER — Other Ambulatory Visit: Payer: Self-pay

## 2021-08-25 VITALS — Ht 59.0 in | Wt 181.0 lb

## 2021-08-25 DIAGNOSIS — Z1211 Encounter for screening for malignant neoplasm of colon: Secondary | ICD-10-CM

## 2021-08-25 NOTE — Progress Notes (Signed)
No egg or soy allergy known to patient  No issues known to pt with past sedation with any surgeries or procedures Patient denies ever being told they had issues or difficulty with intubation  No FH of Malignant Hyperthermia Pt is not on diet pills Pt is not on home 02  Pt is not on blood thinners  Pt denies issues with constipation  No A fib or A flutter Pt is fully vaccinated for Covid x 2 + booster; NO PA's for preps discussed with pt in PV today  Discussed with pt there will be an out-of-pocket cost for prep and that varies from $0 to 70 +  dollars - pt verbalized understanding  Due to the COVID-19 pandemic we are asking patients to follow certain guidelines in PV and the [facility]   Pt aware of COVID protocols and [facility] guidelines

## 2021-09-01 ENCOUNTER — Encounter: Payer: Self-pay | Admitting: Gastroenterology

## 2021-09-08 ENCOUNTER — Ambulatory Visit (AMBULATORY_SURGERY_CENTER): Payer: Medicare Other | Admitting: Gastroenterology

## 2021-09-08 ENCOUNTER — Other Ambulatory Visit: Payer: Self-pay

## 2021-09-08 ENCOUNTER — Encounter: Payer: Self-pay | Admitting: Gastroenterology

## 2021-09-08 VITALS — BP 132/64 | HR 63 | Temp 96.6°F | Resp 15 | Ht 59.0 in | Wt 181.0 lb

## 2021-09-08 DIAGNOSIS — D125 Benign neoplasm of sigmoid colon: Secondary | ICD-10-CM | POA: Diagnosis not present

## 2021-09-08 DIAGNOSIS — Z800 Family history of malignant neoplasm of digestive organs: Secondary | ICD-10-CM | POA: Diagnosis not present

## 2021-09-08 DIAGNOSIS — Z1211 Encounter for screening for malignant neoplasm of colon: Secondary | ICD-10-CM | POA: Diagnosis not present

## 2021-09-08 DIAGNOSIS — D127 Benign neoplasm of rectosigmoid junction: Secondary | ICD-10-CM | POA: Diagnosis not present

## 2021-09-08 DIAGNOSIS — D128 Benign neoplasm of rectum: Secondary | ICD-10-CM

## 2021-09-08 DIAGNOSIS — D123 Benign neoplasm of transverse colon: Secondary | ICD-10-CM | POA: Diagnosis not present

## 2021-09-08 MED ORDER — SODIUM CHLORIDE 0.9 % IV SOLN: 500.0000 mL | Freq: Once | INTRAVENOUS | Status: DC

## 2021-09-08 NOTE — Progress Notes (Signed)
VS taken by DT 

## 2021-09-08 NOTE — Progress Notes (Signed)
Post-op [facility] pH instructions Once you get home:  Eat normally and go about your daily routine/activities Limit drinking fluids or eating between meals Do not chew gum or eat hard candy DO NOT take any antacid or anti-reflux medications during the 48-hour monitoring time, unless instructed by your physician  Recording events: Events to be recorded are:  Record using event buttons on recorder and write on paper diary form Every time you eat or drink something (other than water) 2.   Periods of lying down/reclining 3.  Symptoms:  may include heartburn, regurgitation, chest pain, cough or specify if other.  A paper diary is also provided to record the times of your reflux symptoms and times for meals and when you lie down.  The recorder needs to remain within 3 feet (arms length) of you during the testing period (48 hours). If you should forget and move outside of a 3-foot radius of the receiver you may hear beeping and you will see a "C1" error in the display window on the top of the receiver.  Please pick up the receiver and hold close to you to re-establish the connection and the error message disappears.  You may take a bath/shower during the testing period, but the recorder must not get wet and must remain within 3 feet of you. Please leave the receiver outside of the shower or tub while bathing. The monitoring period will be for 48 hours after placement of the capsule.  At the end of the 48 hours, you will return the recorder, and your diary, to our [facility] front desk.  A nurse will meet you to collect the device and answer any questions you may have.  The device should turn off once the 48 hours is complete.   What to expect after placement of the capsule:  Some patients experience a vague sensation that something is in their esophagus or that they 'feel' the capsule when they swallow food.  Should you experience this, chewing food carefully or drinking liquids may  minimize this sensation.   After the test is complete, the disposable capsule will fall off the wall of your esophagus within 5-10 days and pass naturally with your bowel movement through the digestive tract.  Once the recorder is returned, your provider will review and interpret your recordings and contact you to discuss your results.  This may take up to two weeks.   DO NOT have an MRI for 30 days after your procedure to ensure the capsule is no longer inside your body  It is imperative that you return the recorder on _________________________ by 3:00pm.  Your information must be downloaded at this time to obtain your results.

## 2021-09-08 NOTE — Progress Notes (Signed)
No problems noted in the recovery room. maw 

## 2021-09-08 NOTE — Patient Instructions (Addendum)
Handouts were given to your care partner on polyps, diverticulosis, hemorrhoids,  and a high fiber diet with liberal fluid intake. Your sugar was 92 in the recovery room. Use over the counter FiberCon 1-2 tablets daily. You may resume your current medications today. Await biopsy results.  May take 1-3 weeks to receive pathology results. Please call if any questions or concerns.      YOU HAD AN ENDOSCOPIC PROCEDURE TODAY AT [facility]:   Refer to the procedure report that was given to you for any specific questions about what was found during the examination.  If the procedure report does not answer your questions, please call your gastroenterologist to clarify.  If you requested that your care partner not be given the details of your procedure findings, then the procedure report has been included in a sealed envelope for you to review at your convenience later.  YOU SHOULD EXPECT: Some feelings of bloating in the abdomen. Passage of more gas than usual.  Walking can help get rid of the air that was put into your GI tract during the procedure and reduce the bloating. If you had a lower endoscopy (such as a colonoscopy or flexible sigmoidoscopy) you may notice spotting of blood in your stool or on the toilet paper. If you underwent a bowel prep for your procedure, you may not have a normal bowel movement for a few days.  Please Note:  You might notice some irritation and congestion in your nose or some drainage.  This is from the oxygen used during your procedure.  There is no need for concern and it should clear up in a day or so.  SYMPTOMS TO REPORT IMMEDIATELY:  Following lower endoscopy (colonoscopy or flexible sigmoidoscopy):  Excessive amounts of blood in the stool  Significant tenderness or worsening of abdominal pains  Swelling of the abdomen that is new, acute  Fever of 100F or higher  For urgent or emergent issues, a gastroenterologist can be reached at any hour by  calling [phone]. Do not use MyChart messaging for urgent concerns.    DIET:  We do recommend a small meal at first, but then you may proceed to your regular diet.  Drink plenty of fluids but you should avoid alcoholic beverages for 24 hours.  ACTIVITY:  You should plan to take it easy for the rest of today and you should NOT DRIVE or use heavy machinery until tomorrow (because of the sedation medicines used during the test).    FOLLOW UP: Our staff will call the number listed on your records 48-72 hours following your procedure to check on you and address any questions or concerns that you may have regarding the information given to you following your procedure. If we do not reach you, we will leave a message.  We will attempt to reach you two times.  During this call, we will ask if you have developed any symptoms of COVID 19. If you develop any symptoms (ie: fever, flu-like symptoms, shortness of breath, cough etc.) before then, please call [phone].  If you test positive for Covid 19 in the 2 weeks post procedure, please call and report this information to [place].    If any biopsies were taken you will be contacted by phone or by letter within the next 1-3 weeks.  Please call us at [phone] if you have not heard about the biopsies in 3 weeks.    SIGNATURES/CONFIDENTIALITY: You and/or your care partner have signed paperwork  which will be entered into your electronic medical record.  These signatures attest to the fact that that the information above on your After Visit Summary has been reviewed and is understood.  Full responsibility of the confidentiality of this discharge information lies with you and/or your care-partner.

## 2021-09-08 NOTE — Progress Notes (Signed)
Called to room to assist during endoscopic procedure.  Patient ID and intended procedure confirmed with present staff. Received instructions for my participation in the procedure from the performing physician.  

## 2021-09-08 NOTE — Progress Notes (Signed)
GASTROENTEROLOGY PROCEDURE H&P NOTE   Primary Care Physician: Marrian Salvage, FNP  HPI: Katie Simon is a 65 y.o. female who presents for Colonoscopy for screening.  Past Medical History:  Diagnosis Date   Anemia    hx of   Anxiety    Arthritis    RIGHT knee, lower back   Blood transfusion without reported diagnosis 2003   with hysterecomty   Cataract    RIGHT eye- not a surgical candidate at this time (08/25/2021)   Depression    Diabetes mellitus without complication ([place])    on meds   Hyperlipidemia    on meds   Hypertension    on meds   PTSD (post-traumatic stress disorder)    Seasonal allergies    Past Surgical History:  Procedure Laterality Date   ABDOMINAL HYSTERECTOMY  2003   COLONOSCOPY  2011   per pt- with Dr. Collene Mares   TUBAL LIGATION  1990   Current Outpatient Medications  Medication Sig Dispense Refill   amoxicillin (AMOXIL) 500 MG capsule Take 500 mg by mouth 3 (three) times daily. For dental procedure     losartan (COZAAR) 50 MG tablet Take 1 tablet (50 mg total) by mouth daily. 90 tablet 1   metFORMIN (GLUCOPHAGE XR) 500 MG 24 hr tablet Take 1 tablet (500 mg total) by mouth daily with breakfast. 90 tablet 0   rosuvastatin (CRESTOR) 10 MG tablet Take 1 tablet (10 mg total) by mouth daily. 90 tablet 1   No current facility-administered medications for this visit.    Current Outpatient Medications:    amoxicillin (AMOXIL) 500 MG capsule, Take 500 mg by mouth 3 (three) times daily. For dental procedure, Disp: , Rfl:    losartan (COZAAR) 50 MG tablet, Take 1 tablet (50 mg total) by mouth daily., Disp: 90 tablet, Rfl: 1   metFORMIN (GLUCOPHAGE XR) 500 MG 24 hr tablet, Take 1 tablet (500 mg total) by mouth daily with breakfast., Disp: 90 tablet, Rfl: 0   rosuvastatin (CRESTOR) 10 MG tablet, Take 1 tablet (10 mg total) by mouth daily., Disp: 90 tablet, Rfl: 1 No Known Allergies Family History  Problem Relation Age of Onset   Colon cancer  Mother 54   Colon polyps Mother 5   Diabetes Mother    Aneurysm Mother    Prostate cancer Father    Diabetes Father    Kidney disease Father    Stroke Father    Stomach cancer Sister 71   Ovarian cancer Sister    Diabetes Sister    Colon polyps Brother    Diabetes Brother    Kidney disease Brother    Colon polyps Brother 49   Diabetes Brother    Early death Brother    Bone cancer Brother    Heart disease Brother    Kidney cancer Brother    Esophageal cancer Neg Hx    Rectal cancer Neg Hx    Social History   Socioeconomic History   Marital status: Single    Spouse name: Not on file   Number of children: Not on file   Years of education: Not on file   Highest education level: Not on file  Occupational History   Not on file  Tobacco Use   Smoking status: Never   Smokeless tobacco: Never  Vaping Use   Vaping Use: Never used  Substance and Sexual Activity   Alcohol use: No   Drug use: No   Sexual activity: Not on file  Other Topics Concern   Not on file  Social History Narrative   Not on file   Social Determinants of Health   Financial Resource Strain: Not on file  Food Insecurity: Not on file  Transportation Needs: Not on file  Physical Activity: Not on file  Stress: Not on file  Social Connections: Not on file  Intimate Partner Violence: Not on file    Physical Exam: There were no vitals filed for this visit. There is no height or weight on file to calculate BMI. GEN: NAD EYE: Sclerae anicteric ENT: MMM CV: Non-tachycardic GI: Soft, NT/ND NEURO:  Alert & Oriented x 3  Lab Results: No results for input(s): WBC, HGB, HCT, PLT in the last 72 hours. BMET No results for input(s): NA, K, CL, CO2, GLUCOSE, BUN, CREATININE, CALCIUM in the last 72 hours. LFT No results for input(s): PROT, ALBUMIN, AST, ALT, ALKPHOS, BILITOT, BILIDIR, IBILI in the last 72 hours. PT/INR No results for input(s): LABPROT, INR in the last 72 hours.   Impression /  Plan: This is a 65 y.o.female who presents for Colonoscopy for screening.  The risks and benefits of endoscopic evaluation/treatment were discussed with the patient and/or family; these include but are not limited to the risk of perforation, infection, bleeding, missed lesions, lack of diagnosis, severe illness requiring hospitalization, as well as anesthesia and sedation related illnesses.  The patient's history has been reviewed, patient examined, no change in status, and deemed stable for procedure.  The patient and/or family is agreeable to proceed.    Justice Britain, MD [facility] Advanced [facility]oscopy Office # [phone]

## 2021-09-08 NOTE — Progress Notes (Signed)
Sedate, gd SR, tolerated procedure well, VSS, report to RN 

## 2021-09-08 NOTE — Op Note (Signed)
[facility] Patient Name: Katie Simon Procedure Date: 09/08/2021 12:25 PM MRN: [id number] Endoscopist: Justice Britain , MD Age: 65 Referring MD:  Date of Birth: [birth year] Gender: Female Account #: [id number] Procedure:                Colonoscopy Indications:              Screening in patient at increased risk: Colorectal                            cancer in mother 34 or older Medicines:                Monitored Anesthesia Care Procedure:                Pre-Anesthesia Assessment:                           - Prior to the procedure, a History and Physical                            was performed, and patient medications and                            allergies were reviewed. The patient's tolerance of                            previous anesthesia was also reviewed. The risks                            and benefits of the procedure and the sedation                            options and risks were discussed with the patient.                            All questions were answered, and informed consent                            was obtained. Prior Anticoagulants: The patient has                            taken no previous anticoagulant or antiplatelet                            agents. ASA Grade Assessment: II - A patient with                            mild systemic disease. After reviewing the risks                            and benefits, the patient was deemed in                            satisfactory condition to undergo the procedure.  After obtaining informed consent, the colonoscope                            was passed under direct vision. Throughout the                            procedure, the patient's blood pressure, pulse, and                            oxygen saturations were monitored continuously. The                            Colonoscope was introduced through the anus and                            advanced to the the  cecum, identified by the                            ileocecal valve. The colonoscopy was performed                            without difficulty. The patient tolerated the                            procedure. The quality of the bowel preparation was                            good. The terminal ileum, ileocecal valve,                            appendiceal orifice, and rectum were photographed. Scope In: 12:29:23 PM Scope Out: 12:40:54 PM Scope Withdrawal Time: 0 hours 8 minutes 12 seconds  Total Procedure Duration: 0 hours 11 minutes 31 seconds  Findings:                 The digital rectal exam findings include                            hemorrhoids. Pertinent negatives include no                            palpable rectal lesions.                           The terminal ileum and ileocecal valve appeared                            normal.                           Three sessile polyps were found in the rectum,                            sigmoid colon and transverse colon. The polyps were  3 to 10 mm in size. These polyps were removed with                            a cold snare. Resection and retrieval were complete.                           Multiple small-mouthed diverticula were found in                            the recto-sigmoid colon, sigmoid colon and                            ascending colon.                           Normal mucosa was found in the entire colon                            otherwise.                           Non-bleeding non-thrombosed external and internal                            hemorrhoids were found during retroflexion, during                            perianal exam and during digital exam. The                            hemorrhoids were Grade II (internal hemorrhoids                            that prolapse but reduce spontaneously). Complications:            No immediate complications. Estimated Blood Loss:     Estimated  blood loss was minimal. Impression:               - Hemorrhoids found on digital rectal exam.                           - The examined portion of the ileum was normal.                           - Three 3 to 10 mm polyps in the rectum, in the                            sigmoid colon and in the transverse colon, removed                            with a cold snare. Resected and retrieved.                           - Diverticulosis in the recto-sigmoid colon, in the  sigmoid colon and in the ascending colon.                           - Normal mucosa in the entire examined colon                            otherwise.                           - Non-bleeding non-thrombosed external and internal                            hemorrhoids. Recommendation:           - The patient will be observed post-procedure,                            until all discharge criteria are met.                           - Discharge patient to home.                           - Patient has a contact number available for                            emergencies. The signs and symptoms of potential                            delayed complications were discussed with the                            patient. Return to normal activities tomorrow.                            Written discharge instructions were provided to the                            patient.                           - High fiber diet.                           - Use FiberCon 1-2 tablets PO daily.                           - Continue present medications.                           - Await pathology results.                           - Repeat colonoscopy in 3/5/7 years for                            surveillance based on pathology results.                           -  The findings and recommendations were discussed                            with the patient.                           - The findings and recommendations were discussed                             with the patient's family. Justice Britain, MD 09/08/2021 12:45:54 PM

## 2021-09-08 NOTE — Progress Notes (Signed)
Pt's states no medical or surgical changes since previsit or office visit. 

## 2021-09-10 ENCOUNTER — Telehealth: Payer: Self-pay

## 2021-09-10 NOTE — Telephone Encounter (Signed)
  Follow up Call-  Call back number 09/08/2021  Post procedure Call Back phone  # [phone]  Permission to leave phone message Yes  Some recent data might be hidden     Patient questions:  Do you have a fever, pain , or abdominal swelling? No. Pain Score  0 *  Have you tolerated food without any problems? Yes.    Have you been able to return to your normal activities? Yes.    Do you have any questions about your discharge instructions: Diet   No. Medications  No. Follow up visit  No.  Do you have questions or concerns about your Care? No.  Actions: * If pain score is 4 or above: No action needed, pain <4.   Have you developed a fever since your procedure? no  2.   Have you had an respiratory symptoms (SOB or cough) since your procedure? no  3.   Have you tested positive for COVID 19 since your procedure no  4.   Have you had any family members/close contacts diagnosed with the COVID 19 since your procedure?  no   If yes to any of these questions please route to Joylene John, RN and Joella Prince, RN

## 2021-09-12 ENCOUNTER — Other Ambulatory Visit: Payer: Self-pay | Admitting: Family

## 2021-09-14 ENCOUNTER — Encounter: Payer: Self-pay | Admitting: Gastroenterology

## 2022-02-02 ENCOUNTER — Encounter: Payer: Medicare Other | Admitting: Family

## 2022-02-09 ENCOUNTER — Ambulatory Visit (INDEPENDENT_AMBULATORY_CARE_PROVIDER_SITE_OTHER): Payer: Medicare Other

## 2022-02-09 DIAGNOSIS — Z0000 Encounter for general adult medical examination without abnormal findings: Secondary | ICD-10-CM

## 2022-02-09 NOTE — Progress Notes (Addendum)
Virtual Visit via Telephone Note ? ?I connected with  Katie Simon on 02/09/22 at  1:30 PM EDT by telephone and verified that I am speaking with the correct person using two identifiers. ? ?Medicare Annual Wellness visit completed telephonically due to Covid-19 pandemic.  ? ?Persons participating in this call: This Health Coach and this patient.  ? ?Location: ?Patient: Home ?Provider: Office  ?  ?I discussed the limitations, risks, security and privacy concerns of performing an evaluation and management service by telephone and the availability of in person appointments. The patient expressed understanding and agreed to proceed. ? ?Unable to perform video visit due to video visit attempted and failed and/or patient does not have video capability.  ? ?Some vital signs may be absent or patient reported.  ? ?Willette Brace, LPN ? ? ?Subjective:  ? Katie Simon is a 66 y.o. female who presents for Medicare Annual (Subsequent) preventive examination. ? ?Review of Systems    ? ?Cardiac Risk Factors include: advanced age (>24mn, >>69women) ? ?   ?Objective:  ?  ?There were no vitals filed for this visit. ?There is no height or weight on file to calculate BMI. ? ? ?  02/09/2022  ?  1:42 PM 08/24/2017  ?  5:09 PM 08/24/2017  ? 12:30 PM 08/11/2016  ? 12:53 PM 07/14/2015  ? 11:29 PM 07/14/2015  ?  3:16 PM  ?Advanced Directives  ?Does Patient Have a Medical Advance Directive? No No No No  No  ?Would patient like information on creating a medical advance directive? No - Patient declined   No - patient declined information  Yes - Educational materials given  ?  ? Information is confidential and restricted. Go to Review Flowsheets to unlock data.  ? ? ?Current Medications (verified) ?Outpatient Encounter Medications as of 02/09/2022  ?Medication Sig  ? amoxicillin (AMOXIL) 500 MG capsule Take 500 mg by mouth 3 (three) times daily. For dental procedure  ? metFORMIN (GLUCOPHAGE-XR) 500 MG 24 hr tablet Take 1 tablet by  mouth once daily with breakfast (Patient not taking: Reported on 02/09/2022)  ? rosuvastatin (CRESTOR) 10 MG tablet Take 1 tablet (10 mg total) by mouth daily. (Patient not taking: Reported on 02/09/2022)  ? [DISCONTINUED] losartan (COZAAR) 50 MG tablet Take 1 tablet (50 mg total) by mouth daily. (Patient not taking: Reported on 09/08/2021)  ? ?No facility-administered encounter medications on file as of 02/09/2022.  ? ? ?Allergies (verified) ?Patient has no known allergies.  ? ?History: ?Past Medical History:  ?Diagnosis Date  ? Anemia   ? hx of  ? Anxiety   ? Arthritis   ? RIGHT knee, lower back  ? Blood transfusion without reported diagnosis 2003  ? with hysterecomty  ? Cataract   ? RIGHT eye- not a surgical candidate at this time (08/25/2021)  ? Depression   ? [place]   ? on meds  ? Hyperlipidemia   ? on meds  ? Hypertension   ? on meds  ? PTSD (post-traumatic stress disorder)   ? Seasonal allergies   ? ?Past Surgical History:  ?Procedure Laterality Date  ? ABDOMINAL HYSTERECTOMY  2003  ? COLONOSCOPY  2011  ? per pt- with Dr. MCollene Mares ? TUBAL LIGATION  1990  ? ?Family History  ?Problem Relation Age of Onset  ? Colon cancer Mother 825 ? Colon polyps Mother 836 ? Diabetes Mother   ? Aneurysm Mother   ? Prostate cancer Father   ?  Diabetes Father   ? Kidney disease Father   ? Stroke Father   ? Stomach cancer Sister 64  ? Ovarian cancer Sister   ? Diabetes Sister   ? Colon polyps Brother   ? Diabetes Brother   ? Kidney disease Brother   ? Colon polyps Brother 48  ? Diabetes Brother   ? Early death Brother   ? Bone cancer Brother   ? Heart disease Brother   ? Kidney cancer Brother   ? Esophageal cancer Neg Hx   ? Rectal cancer Neg Hx   ? ?Social History  ? ?Socioeconomic History  ? Marital status: Single  ?  Spouse name: Not on file  ? Number of children: Not on file  ? Years of education: Not on file  ? Highest education level: Not on file  ?Occupational History  ? Not on file  ?Tobacco  Use  ? Smoking status: Never  ? Smokeless tobacco: Never  ?Vaping Use  ? Vaping Use: Never used  ?Substance and Sexual Activity  ? Alcohol use: No  ? Drug use: No  ? Sexual activity: Not on file  ?Other Topics Concern  ? Not on file  ?Social History Narrative  ? Not on file  ? ?Social Determinants of Health  ? ?Financial Resource Strain: Low Risk   ? Difficulty of Paying Living Expenses: Not hard at all  ?Food Insecurity: No Food Insecurity  ? Worried About Charity fundraiser in the Last Year: Never true  ? Ran Out of Food in the Last Year: Never true  ?Transportation Needs: No Transportation Needs  ? Lack of Transportation (Medical): No  ? Lack of Transportation (Non-Medical): No  ?Physical Activity: Insufficiently Active  ? Days of Exercise per Week: 3 days  ? Minutes of Exercise per Session: 20 min  ?Stress: No Stress Concern Present  ? Feeling of Stress : Not at all  ?Social Connections: Moderately Isolated  ? Frequency of Communication with Friends and Family: More than three times a week  ? Frequency of Social Gatherings with Friends and Family: More than three times a week  ? Attends Religious Services: 1 to 4 times per year  ? Active Member of Clubs or Organizations: No  ? Attends Archivist Meetings: Never  ? Marital Status: Never married  ? ? ?Tobacco Counseling ?Counseling given: Not Answered ? ? ?Clinical Intake: ? ?Pre-visit preparation completed: Yes ? ?Pain : No/denies pain ? ?  ? ?Nutritional Risks: None ?Diabetes: No ? ?How often do you need to have someone help you when you read instructions, pamphlets, or other written materials from your doctor or pharmacy?: 1 - Never ? ?Diabetic?No ? ?Interpreter Needed?: No ? ?Information entered by :: Charlott Rakes, LPN ? ? ?Activities of Daily Living ? ?  02/09/2022  ?  1:43 PM  ?In your present state of health, do you have any difficulty performing the following activities:  ?Hearing? 0  ?Vision? 0  ?Difficulty concentrating or making decisions? 0   ?Walking or climbing stairs? 0  ?Dressing or bathing? 0  ?Doing errands, shopping? 0  ?Preparing Food and eating ? N  ?Using the Toilet? N  ?In the past six months, have you accidently leaked urine? N  ?Do you have problems with loss of bowel control? N  ?Managing your Medications? N  ?Managing your Finances? N  ?Housekeeping or managing your Housekeeping? N  ? ? ?Patient Care Team: ?Marrian Salvage, FNP as PCP - General (Internal  Medicine) ? ?Indicate any recent Medical Services you may have received from other than [facility] providers in the past year (date may be approximate). ? ?   ?Assessment:  ? This is a routine wellness examination for [place]. ? ?Hearing/Vision screen ?Hearing Screening - Comments:: Pt denies any hearing issue  ?V[facility] Screening - Comments:: [facility]  ? ?Dietary issues and exercise activities discussed: ?Current Exercise Habits: Home exercise routine, Type of exercise: walking, Time (Minutes): 25, Frequency (Times/Week): 3, Weekly Exercise (Minutes/Week): 75 ? ? Goals Addressed   ? ?  ?  ?  ?  ? This Visit's Progress  ?  Patient Stated     ?  Lose weight  ?  ? ?  ? ?Depression Screen ? ?  02/09/2022  ?  1:40 PM 06/16/2021  ?  9:38 AM 03/12/2021  ? 10:16 AM  ?PHQ 2/9 Scores  ?PHQ - 2 Score 1 0 1  ?  ?Fall Risk ? ?  02/09/2022  ?  1:43 PM 06/16/2021  ?  9:38 AM 03/12/2021  ? 10:16 AM  ?Fall Risk   ?Falls in the past year? 0 0 0  ?Number falls in past yr: 0 0 0  ?Injury with Fall? 0 0 0  ?Risk for fall due to : Impaired vision No Fall Risks No Fall Risks  ?Follow up Falls prevention discussed Falls evaluation completed Falls evaluation completed  ? ? ?FALL RISK PREVENTION PERTAINING TO THE HOME: ? ?Any stairs in or around the home? Yes  ?If so, are there any without handrails? No  ?Home free of loose throw rugs in walkways, pet beds, electrical cords, etc? Yes  ?Adequate lighting in your home to reduce risk of falls? Yes  ? ?ASSISTIVE DEVICES UTILIZED TO  PREVENT FALLS: ? ?Life alert? No  ?Use of a cane, walker or w/c? No  ?Grab bars in the bathroom? No  ?Shower chair or bench in shower? No  ?Elevated toilet seat or a handicapped toilet? No  ? ?TIMED UP

## 2022-02-09 NOTE — Patient Instructions (Signed)
Katie Simon , ?Thank you for taking time to come for your Medicare Wellness Visit. I appreciate your ongoing commitment to your health goals. Please review the following plan we discussed and let me know if I can assist you in the future.  ? ?Screening recommendations/referrals: ?Colonoscopy: 09/18/21 repeat every 5 years ?Mammogram: done 03/24/21 repeat every year ?Bone Density: done 03/24/21 repeat every 2 year ?Recommended yearly ophthalmology/optometry visit for glaucoma screening and checkup ?Recommended yearly dental visit for hygiene and checkup ? ?Vaccinations: ?Influenza vaccine: due ?Pneumococcal vaccine: Up to date ?Tdap vaccine: done 08/24/17 repeat every 10 years  ?Shingles vaccine: Shingrix discussed. Please contact your pharmacy for coverage information.    ?Covid-19:Completed 3/26/, 02/18/20 & 11/06/20 ? ?Advanced directives: Advance directive discussed with you today. Even though you declined this today please call our office should you change your mind and we can give you the proper paperwork for you to fill out. ? ?Conditions/risks identified: Lose weight  ? ?Next appointment: Follow up in one year for your annual wellness visit  ? ? ?Preventive Care 12 Years and Older, Female ?Preventive care refers to lifestyle choices and visits with your health care provider that can promote health and wellness. ?What does preventive care include? ?A yearly physical exam. This is also called an annual well check. ?Dental exams once or twice a year. ?Routine eye exams. Ask your health care provider how often you should have your eyes checked. ?Personal lifestyle choices, including: ?Daily care of your teeth and gums. ?Regular physical activity. ?Eating a healthy diet. ?Avoiding tobacco and drug use. ?Limiting alcohol use. ?Practicing safe sex. ?Taking low-dose aspirin every day. ?Taking vitamin and mineral supplements as recommended by your health care provider. ?What happens during an annual well check? ?The  services and screenings done by your health care provider during your annual well check will depend on your age, overall health, lifestyle risk factors, and family history of disease. ?Counseling  ?Your health care provider may ask you questions about your: ?Alcohol use. ?Tobacco use. ?Drug use. ?Emotional well-being. ?Home and relationship well-being. ?Sexual activity. ?Eating habits. ?History of falls. ?Memory and ability to understand (cognition). ?Work and work Statistician. ?Reproductive health. ?Screening  ?You may have the following tests or measurements: ?Height, weight, and BMI. ?Blood pressure. ?Lipid and cholesterol levels. These may be checked every 5 years, or more frequently if you are over 51 years old. ?Skin check. ?Lung cancer screening. You may have this screening every year starting at age 23 if you have a 30-pack-year history of smoking and currently smoke or have quit within the past 15 years. ?Fecal occult blood test (FOBT) of the stool. You may have this test every year starting at age 80. ?Flexible sigmoidoscopy or colonoscopy. You may have a sigmoidoscopy every 5 years or a colonoscopy every 10 years starting at age 59. ?Hepatitis C blood test. ?Hepatitis B blood test. ?Sexually transmitted disease (STD) testing. ?Diabetes screening. This is done by checking your blood sugar (glucose) after you have not eaten for a while (fasting). You may have this done every 1-3 years. ?Bone density scan. This is done to screen for osteoporosis. You may have this done starting at age 3. ?Mammogram. This may be done every 1-2 years. Talk to your health care provider about how often you should have regular mammograms. ?Talk with your health care provider about your test results, treatment options, and if necessary, the need for more tests. ?Vaccines  ?Your health care provider may recommend certain vaccines, such  as: ?Influenza vaccine. This is recommended every year. ?Tetanus, diphtheria, and acellular  pertussis (Tdap, Td) vaccine. You may need a Td booster every 10 years. ?Zoster vaccine. You may need this after age 31. ?Pneumococcal 13-valent conjugate (PCV13) vaccine. One dose is recommended after age 58. ?Pneumococcal polysaccharide (PPSV23) vaccine. One dose is recommended after age 67. ?Talk to your health care provider about which screenings and vaccines you need and how often you need them. ?This information is not intended to replace advice given to you by your health care provider. Make sure you discuss any questions you have with your health care provider. ?Document Released: 11/14/2015 Document Revised: 07/07/2016 Document Reviewed: 08/19/2015 ?Elsevier Interactive Patient Education ? 2017 [place]. ? ?Fall Prevention in the Home ?Falls can cause injuries. They can happen to people of all ages. There are many things you can do to make your home safe and to help prevent falls. ?What can I do on the outside of my home? ?Regularly fix the edges of walkways and driveways and fix any cracks. ?Remove anything that might make you trip as you walk through a door, such as a raised step or threshold. ?Trim any bushes or trees on the path to your home. ?Use bright outdoor lighting. ?Clear any walking paths of anything that might make someone trip, such as rocks or tools. ?Regularly check to see if handrails are loose or broken. Make sure that both sides of any steps have handrails. ?Any raised decks and porches should have guardrails on the edges. ?Have any leaves, snow, or ice cleared regularly. ?Use sand or salt on walking paths during winter. ?Clean up any spills in your garage right away. This includes oil or grease spills. ?What can I do in the bathroom? ?Use night lights. ?Install grab bars by the toilet and in the tub and shower. Do not use towel bars as grab bars. ?Use non-skid mats or decals in the tub or shower. ?If you need to sit down in the shower, use a plastic, non-slip stool. ?Keep the floor  dry. Clean up any water that spills on the floor as soon as it happens. ?Remove soap buildup in the tub or shower regularly. ?Attach bath mats securely with double-sided non-slip rug tape. ?Do not have throw rugs and other things on the floor that can make you trip. ?What can I do in the bedroom? ?Use night lights. ?Make sure that you have a light by your bed that is easy to reach. ?Do not use any sheets or blankets that are too big for your bed. They should not hang down onto the floor. ?Have a firm chair that has side arms. You can use this for support while you get dressed. ?Do not have throw rugs and other things on the floor that can make you trip. ?What can I do in the kitchen? ?Clean up any spills right away. ?Avoid walking on wet floors. ?Keep items that you use a lot in easy-to-reach places. ?If you need to reach something above you, use a strong step stool that has a grab bar. ?Keep electrical cords out of the way. ?Do not use floor polish or wax that makes floors slippery. If you must use wax, use non-skid floor wax. ?Do not have throw rugs and other things on the floor that can make you trip. ?What can I do with my stairs? ?Do not leave any items on the stairs. ?Make sure that there are handrails on both sides of the stairs and use  them. Fix handrails that are broken or loose. Make sure that handrails are as long as the stairways. ?Check any carpeting to make sure that it is firmly attached to the stairs. Fix any carpet that is loose or worn. ?Avoid having throw rugs at the top or bottom of the stairs. If you do have throw rugs, attach them to the floor with carpet tape. ?Make sure that you have a light switch at the top of the stairs and the bottom of the stairs. If you do not have them, ask someone to add them for you. ?What else can I do to help prevent falls? ?Wear shoes that: ?Do not have high heels. ?Have rubber bottoms. ?Are comfortable and fit you well. ?Are closed at the toe. Do not wear  sandals. ?If you use a stepladder: ?Make sure that it is fully opened. Do not climb a closed stepladder. ?Make sure that both sides of the stepladder are locked into place. ?Ask someone to hold it for you, if possible.

## 2022-02-16 ENCOUNTER — Ambulatory Visit (INDEPENDENT_AMBULATORY_CARE_PROVIDER_SITE_OTHER): Payer: Medicare Other | Admitting: Family

## 2022-02-16 ENCOUNTER — Encounter: Payer: Self-pay | Admitting: Family

## 2022-02-16 VITALS — BP 160/80 | HR 72 | Temp 98.6°F | Ht 59.0 in | Wt 187.0 lb

## 2022-02-16 DIAGNOSIS — E785 Hyperlipidemia, unspecified: Secondary | ICD-10-CM | POA: Diagnosis not present

## 2022-02-16 DIAGNOSIS — R7309 Other abnormal glucose: Secondary | ICD-10-CM

## 2022-02-16 DIAGNOSIS — I10 Essential (primary) hypertension: Secondary | ICD-10-CM

## 2022-02-16 MED ORDER — HYDROCHLOROTHIAZIDE 25 MG PO TABS: 25.0000 mg | ORAL_TABLET | Freq: Every day | ORAL | 1 refills | Status: DC

## 2022-02-16 NOTE — Progress Notes (Signed)
?Katie Simon is a 66 y.o. female with the following history as recorded in EpicCare:  ?Patient Active Problem List  ? Diagnosis Date Noted  ? Brief psychotic disorder ([place]) 07/16/2015  ? PTSD (post-traumatic stress disorder) 07/15/2015  ? Psychosis ([place]) 07/15/2015  ?  ?Current Outpatient Medications  ?Medication Sig Dispense Refill  ? hydrochlorothiazide (HYDRODIURIL) 25 MG tablet Take 1 tablet (25 mg total) by mouth daily. 90 tablet 1  ? ?No current facility-administered medications for this visit.  ?  ?Allergies: Patient has no known allergies.  ?Past Medical History:  ?Diagnosis Date  ? Anemia   ? hx of  ? Anxiety   ? Arthritis   ? RIGHT knee, lower back  ? Blood transfusion without reported diagnosis 2003  ? with hysterecomty  ? Cataract   ? RIGHT eye- not a surgical candidate at this time (08/25/2021)  ? Depression   ? Diabetes mellitus without complication ([place])   ? on meds  ? Hyperlipidemia   ? on meds  ? Hypertension   ? on meds  ? PTSD (post-traumatic stress disorder)   ? Seasonal allergies   ?  ?Past Surgical History:  ?Procedure Laterality Date  ? ABDOMINAL HYSTERECTOMY  2003  ? COLONOSCOPY  2011  ? per pt- with Dr. Collene Mares  ? TUBAL LIGATION  1990  ?  ?Family History  ?Problem Relation Age of Onset  ? Colon cancer Mother 41  ? Colon polyps Mother 20  ? Diabetes Mother   ? Aneurysm Mother   ? Prostate cancer Father   ? Diabetes Father   ? Kidney disease Father   ? Stroke Father   ? Stomach cancer Sister 48  ? Ovarian cancer Sister   ? Diabetes Sister   ? Colon polyps Brother   ? Diabetes Brother   ? Kidney disease Brother   ? Colon polyps Brother 12  ? Diabetes Brother   ? Early death Brother   ? Bone cancer Brother   ? Heart disease Brother   ? Kidney cancer Brother   ? Esophageal cancer Neg Hx   ? Rectal cancer Neg Hx   ?  ?Social History  ? ?Tobacco Use  ? Smoking status: Never  ? Smokeless tobacco: Never  ?Substance Use Topics  ? Alcohol use: No  ?  ?Subjective:  ?Patient presents for follow up;  last seen in August 2022; was stared on Losartan, Crestor and Metformin; notes "she did not understand that she needed to continue her medication since no one from the pharmacy called her;" ?Notes however that she did not like how she felt on Metformin and Losartan; has taken HCTZ in the past and would be willing to consider re-starting this medication.  ? ?Denies any chest pain, shortness of breath, blurred vision or headache ? ? ? ? ? ?Objective:  ?Vitals:  ? 02/16/22 1303  ?BP: (!) 160/80  ?Pulse: 72  ?Temp: 98.6 ?F (37 ?C)  ?TempSrc: Oral  ?SpO2: 97%  ?Weight: 187 lb (84.8 kg)  ?Height: $RemoveB'4\' 11"'aGyjIISf$  (1.499 m)  ?  ?General: Well developed, well nourished, in no acute distress  ?Skin : Warm and dry.  ?Head: Normocephalic and atraumatic  ?Lungs: Respirations unlabored; clear to auscultation bilaterally without wheeze, rales, rhonchi  ?CVS exam: normal rate and regular rhythm.  ?Musculoskeletal: No deformities; no active joint inflammation  ?Extremities: No edema, cyanosis, clubbing  ?Vessels: Symmetric bilaterally  ?Neurologic: Alert and oriented; speech intact; face symmetrical; moves all extremities well; CNII-XII intact without focal deficit  ? ?  Assessment:  ?1. Primary hypertension   ?2. Elevated glucose   ?3. Hyperlipidemia, unspecified hyperlipidemia type   ?  ?Plan:  ?Patient does not want to go back on Losartan; re-start HCTZ 25 mg daily; stressed need to take medication daily as prescribed; follow up in 1 month; ?Update Hgba1c today; will refer to nutritionist; patient prefers not to re-start Metformin; follow up to be determined; ?Update lipid panel today;  ? ?This visit occurred during the SARS-CoV-2 public health emergency.  Safety protocols were in place, including screening questions prior to the visit, additional usage of staff PPE, and extensive cleaning of exam room while observing appropriate contact time as indicated for disinfecting solutions.  ? ? ?Return in about 1 month (around 03/18/2022) for CPE.   ?Orders Placed This Encounter  ?Procedures  ? Comp Met (CMET)  ? Hemoglobin A1c  ? Lipid panel  ?  ?Requested Prescriptions  ? ?Signed Prescriptions Disp Refills  ? hydrochlorothiazide (HYDRODIURIL) 25 MG tablet 90 tablet 1  ?  Sig: Take 1 tablet (25 mg total) by mouth daily.  ?  ? ? ? ? ?

## 2022-02-17 LAB — LIPID PANEL
Cholesterol: 193 mg/dL (ref 0–200)
HDL: 51.3 mg/dL (ref >39.00)
LDL Cholesterol: 114 mg/dL — ABNORMAL HIGH (ref 0–99)
NonHDL: 141.73
Total CHOL/HDL Ratio: 4
Triglycerides: 141 mg/dL (ref 0.0–149.0)
VLDL: 28.2 mg/dL (ref 0.0–40.0)

## 2022-02-17 LAB — COMPREHENSIVE METABOLIC PANEL
ALT: 11 U/L (ref 0–35)
AST: 17 U/L (ref 0–37)
Albumin: 4.2 g/dL (ref 3.5–5.2)
Alkaline Phosphatase: 79 U/L (ref 39–117)
BUN: 10 mg/dL (ref 6–23)
CO2: 30 mEq/L (ref 19–32)
Calcium: 9.4 mg/dL (ref 8.4–10.5)
Chloride: 104 mEq/L (ref 96–112)
Creatinine, Ser: 0.81 mg/dL (ref 0.40–1.20)
GFR: 75.44 mL/min (ref >60.00)
Glucose, Bld: 104 mg/dL — ABNORMAL HIGH (ref 70–99)
Potassium: 4.4 mEq/L (ref 3.5–5.1)
Sodium: 140 mEq/L (ref 135–145)
Total Bilirubin: 0.4 mg/dL (ref 0.2–1.2)
Total Protein: 7.4 g/dL (ref 6.0–8.3)

## 2022-02-17 LAB — HEMOGLOBIN A1C: Hgb A1c MFr Bld: 7.2 % — ABNORMAL HIGH (ref 4.6–6.5)

## 2022-03-18 ENCOUNTER — Encounter: Payer: Medicare Other | Admitting: Family

## 2022-03-19 ENCOUNTER — Encounter: Payer: Medicare Other | Admitting: Family

## 2022-04-13 ENCOUNTER — Telehealth (HOSPITAL_BASED_OUTPATIENT_CLINIC_OR_DEPARTMENT_OTHER): Payer: Self-pay

## 2022-04-13 ENCOUNTER — Ambulatory Visit (INDEPENDENT_AMBULATORY_CARE_PROVIDER_SITE_OTHER): Payer: Medicare Other | Admitting: Family

## 2022-04-13 ENCOUNTER — Encounter: Payer: Self-pay | Admitting: Family

## 2022-04-13 VITALS — BP 130/80 | HR 74 | Temp 98.3°F | Resp 12 | Ht 59.0 in | Wt 183.8 lb

## 2022-04-13 DIAGNOSIS — E119 Type 2 diabetes mellitus without complications: Secondary | ICD-10-CM

## 2022-04-13 DIAGNOSIS — Z0000 Encounter for general adult medical examination without abnormal findings: Secondary | ICD-10-CM

## 2022-04-13 DIAGNOSIS — Z13220 Encounter for screening for lipoid disorders: Secondary | ICD-10-CM | POA: Diagnosis not present

## 2022-04-13 DIAGNOSIS — Z1231 Encounter for screening mammogram for malignant neoplasm of breast: Secondary | ICD-10-CM

## 2022-04-13 DIAGNOSIS — I10 Essential (primary) hypertension: Secondary | ICD-10-CM | POA: Diagnosis not present

## 2022-04-13 LAB — CBC WITH DIFFERENTIAL/PLATELET
Basophils Absolute: 0 10*3/uL (ref 0.0–0.1)
Basophils Relative: 0.5 % (ref 0.0–3.0)
Eosinophils Absolute: 0.2 10*3/uL (ref 0.0–0.7)
Eosinophils Relative: 4.1 % (ref 0.0–5.0)
HCT: 41.7 % (ref 36.0–46.0)
Hemoglobin: 13.5 g/dL (ref 12.0–15.0)
Lymphocytes Relative: 39.7 % (ref 12.0–46.0)
Lymphs Abs: 2.2 10*3/uL (ref 0.7–4.0)
MCHC: 32.4 g/dL (ref 30.0–36.0)
MCV: 82.6 fl (ref 78.0–100.0)
Monocytes Absolute: 0.3 10*3/uL (ref 0.1–1.0)
Monocytes Relative: 5.8 % (ref 3.0–12.0)
Neutro Abs: 2.8 10*3/uL (ref 1.4–7.7)
Neutrophils Relative %: 49.9 % (ref 43.0–77.0)
Platelets: 282 10*3/uL (ref 150.0–400.0)
RBC: 5.05 Mil/uL (ref 3.87–5.11)
RDW: 14.1 % (ref 11.5–15.5)
WBC: 5.6 10*3/uL (ref 4.0–10.5)

## 2022-04-13 LAB — COMPREHENSIVE METABOLIC PANEL
ALT: 16 U/L (ref 0–35)
AST: 19 U/L (ref 0–37)
Albumin: 3.9 g/dL (ref 3.5–5.2)
Alkaline Phosphatase: 87 U/L (ref 39–117)
BUN: 9 mg/dL (ref 6–23)
CO2: 30 mEq/L (ref 19–32)
Calcium: 9.3 mg/dL (ref 8.4–10.5)
Chloride: 99 mEq/L (ref 96–112)
Creatinine, Ser: 0.87 mg/dL (ref 0.40–1.20)
GFR: 69.17 mL/min (ref >60.00)
Glucose, Bld: 171 mg/dL — ABNORMAL HIGH (ref 70–99)
Potassium: 4 mEq/L (ref 3.5–5.1)
Sodium: 137 mEq/L (ref 135–145)
Total Bilirubin: 0.5 mg/dL (ref 0.2–1.2)
Total Protein: 7.2 g/dL (ref 6.0–8.3)

## 2022-04-13 LAB — LIPID PANEL
Cholesterol: 187 mg/dL (ref 0–200)
HDL: 48 mg/dL (ref >39.00)
LDL Cholesterol: 106 mg/dL — ABNORMAL HIGH (ref 0–99)
NonHDL: 139.13
Total CHOL/HDL Ratio: 4
Triglycerides: 167 mg/dL — ABNORMAL HIGH (ref 0.0–149.0)
VLDL: 33.4 mg/dL (ref 0.0–40.0)

## 2022-04-13 LAB — MICROALBUMIN / CREATININE URINE RATIO
Creatinine,U: 91.3 mg/dL
Microalb Creat Ratio: 0.8 mg/g (ref 0.0–30.0)
Microalb, Ur: <0.7 mg/dL (ref 0.0–1.9)

## 2022-04-13 LAB — HEMOGLOBIN A1C: Hgb A1c MFr Bld: 7.8 % — ABNORMAL HIGH (ref 4.6–6.5)

## 2022-04-13 MED ORDER — FLUTICASONE PROPIONATE 50 MCG/ACT NA SUSP: 2.0000 | Freq: Every day | NASAL | 6 refills | Status: DC

## 2022-04-13 MED ORDER — ROSUVASTATIN CALCIUM 10 MG PO TABS: 10.0000 mg | ORAL_TABLET | Freq: Every day | ORAL | 3 refills | Status: DC

## 2022-04-13 NOTE — Progress Notes (Signed)
Katie Simon is a 66 y.o. female with the following history as recorded in EpicCare:  Patient Active Problem List   Diagnosis Date Noted   Brief psychotic disorder ([place]) 07/16/2015   PTSD (post-traumatic stress disorder) 07/15/2015   Psychosis ([place]) 07/15/2015    Current Outpatient Medications  Medication Sig Dispense Refill   hydrochlorothiazide (HYDRODIURIL) 25 MG tablet Take 1 tablet (25 mg total) by mouth daily. 90 tablet 1   rosuvastatin (CRESTOR) 10 MG tablet Take 1 tablet (10 mg total) by mouth daily. 90 tablet 3   No current facility-administered medications for this visit.    Allergies: Patient has no known allergies.  Past Medical History:  Diagnosis Date   Anemia    hx of   Anxiety    Arthritis    RIGHT knee, lower back   Blood transfusion without reported diagnosis 2003   with hysterecomty   Cataract    RIGHT eye- not a surgical candidate at this time (08/25/2021)   Depression    Diabetes mellitus without complication ([place])    on meds   Hyperlipidemia    on meds   Hypertension    on meds   PTSD (post-traumatic stress disorder)    Seasonal allergies     Past Surgical History:  Procedure Laterality Date   ABDOMINAL HYSTERECTOMY  2003   COLONOSCOPY  2011   per pt- with Dr. Collene Mares   TUBAL LIGATION  1990    Family History  Problem Relation Age of Onset   Colon cancer Mother 18   Colon polyps Mother 66   Diabetes Mother    Aneurysm Mother    Prostate cancer Father    Diabetes Father    Kidney disease Father    Stroke Father    Stomach cancer Sister 77   Ovarian cancer Sister    Diabetes Sister    Colon polyps Brother    Diabetes Brother    Kidney disease Brother    Colon polyps Brother 72   Diabetes Brother    Early death Brother    Bone cancer Brother    Heart disease Brother    Kidney cancer Brother    Esophageal cancer Neg Hx    Rectal cancer Neg Hx     Social History   Tobacco Use   Smoking status: Never   Smokeless tobacco:  Never  Substance Use Topics   Alcohol use: No    Subjective:   Presents for yearly CPE; when last seen in April, she noted she did not want to take medication for her diabetes; wanted to work on diet/ exercise; did agree to go back on her blood pressure medication; she only wanted to take HCTZ;  Reviewed need for cholesterol medication- she did take Crestor and agrees to re-start;   Due for eye exam in August 2023; overdue to see dentist- planning to get scheduled;  Planning to get Shingrix through her pharmacy;  Will be starting Silver Sneakers through [facility];   Health Maintenance  Topic Date Due   Zoster Vaccines- Shingrix (1 of 2) Never done   INFLUENZA VACCINE  06/01/2022   MAMMOGRAM  03/25/2023   COLONOSCOPY (Pts 45-28yrs Insurance coverage will need to be confirmed)  09/08/2026   TETANUS/TDAP  08/25/2027   Pneumonia Vaccine 30+ Years old  Completed   DEXA SCAN  Completed   Hepatitis C Screening  Completed   HPV VACCINES  Aged Out   COVID-19 Vaccine  Discontinued   Review of Systems  Constitutional: Negative.  HENT: Negative.    Eyes: Negative.   Respiratory: Negative.    Cardiovascular: Negative.   Gastrointestinal: Negative.   Genitourinary: Negative.   Musculoskeletal: Negative.   Skin: Negative.   Neurological: Negative.   Endo/Heme/Allergies: Negative.   Psychiatric/Behavioral: Negative.         Objective:  Vitals:   04/13/22 0941  BP: 130/80  Pulse: 74  Resp: 12  Temp: 98.3 F (36.8 C)  TempSrc: Oral  SpO2: 97%  Weight: 183 lb 12.8 oz (83.4 kg)  Height: 4' 11" (1.499 m)    General: Well developed, well nourished, in no acute distress  Skin : Warm and dry.  Head: Normocephalic and atraumatic  Eyes: Sclera and conjunctiva clear; pupils round and reactive to light; extraocular movements intact  Ears: External normal; canals clear; tympanic membranes normal  Oropharynx: Pink, supple. No suspicious lesions  Neck: Supple without thyromegaly,  adenopathy  Lungs: Respirations unlabored; clear to auscultation bilaterally without wheeze, rales, rhonchi  CVS exam: normal rate and regular rhythm.  Abdomen: Soft; nontender; nondistended; normoactive bowel sounds; no masses or hepatosplenomegaly  Musculoskeletal: No deformities; no active joint inflammation  Extremities: No edema, cyanosis, clubbing  Vessels: Symmetric bilaterally  Neurologic: Alert and oriented; speech intact; face symmetrical; moves all extremities well; CNII-XII intact without focal deficit  Assessment:  1. PE (physical exam), annual   2. Visit for screening mammogram   3. Lipid screening   4. Type 2 diabetes mellitus without complication, without long-term current use of insulin (HCC)     Plan:  Age appropriate preventive healthcare needs addressed; encouraged regular eye doctor and dental exams; encouraged regular exercise; will update labs and refills as needed today; follow-up to be determined; Patient agrees to continue HCTZ and refill is updated. She agrees to re-start Crestor and refill is updated. She does not want to take Metformin at this time;  Follow up in 4-6 months;   She will get her Shingrix at her local pharmacy.   No follow-ups on file.  Orders Placed This Encounter  Procedures   MM Digital Screening    Standing Status:   Future    Standing Expiration Date:   04/14/2023    Order Specific Question:   Reason for Exam (SYMPTOM  OR DIAGNOSIS REQUIRED)    Answer:   screening mammogram    Order Specific Question:   Preferred imaging location?    Answer:   [facility]   CBC with Differential/Platelet   Comp Met (CMET)   Lipid panel   Hemoglobin A1c   Urine Microalbumin w/creat. ratio    Requested Prescriptions   Signed Prescriptions Disp Refills   rosuvastatin (CRESTOR) 10 MG tablet 90 tablet 3    Sig: Take 1 tablet (10 mg total) by mouth daily.

## 2022-04-14 ENCOUNTER — Other Ambulatory Visit: Payer: Self-pay | Admitting: Family

## 2022-04-14 MED ORDER — METFORMIN HCL ER 500 MG PO TB24: 500.0000 mg | ORAL_TABLET | Freq: Every day | ORAL | 1 refills | Status: DC

## 2022-04-19 ENCOUNTER — Encounter (HOSPITAL_BASED_OUTPATIENT_CLINIC_OR_DEPARTMENT_OTHER): Payer: Self-pay

## 2022-04-19 ENCOUNTER — Ambulatory Visit (HOSPITAL_BASED_OUTPATIENT_CLINIC_OR_DEPARTMENT_OTHER)
Admission: RE | Admit: 2022-04-19 | Discharge: 2022-04-19 | Disposition: A | Discharge status: Home / Self Care | Payer: Medicare Other | Source: Ambulatory Visit | Attending: Family | Admitting: Family

## 2022-04-19 DIAGNOSIS — Z1231 Encounter for screening mammogram for malignant neoplasm of breast: Secondary | ICD-10-CM | POA: Diagnosis not present

## 2022-06-22 ENCOUNTER — Other Ambulatory Visit: Payer: Self-pay | Admitting: *Deleted

## 2022-06-22 NOTE — Patient Outreach (Signed)
  Care Coordination   Initial Visit Note   06/22/2022 Name: Katie Simon MRN: [id number] DOB: April 08, 1956  Katie Simon is a 66 y.o. year old female who sees Katie Salvage, FNP for primary care. I spoke with  Katie Simon by phone today  RN discussed services [facility] services, RN, SW, and Pharmacist. Patient declined services.    Goals Addressed   None     SDOH assessments and interventions completed:  No     Care Coordination Interventions Activated:  No  Care Coordination Interventions:  No, not indicated   Follow up plan: No further intervention required.   Encounter Outcome:  Pt. Visit Completed   [place] [facility] [phone]

## 2022-08-10 ENCOUNTER — Ambulatory Visit: Payer: Medicare Other | Admitting: Family

## 2022-08-12 ENCOUNTER — Ambulatory Visit: Payer: Medicare Other | Admitting: Family

## 2022-11-03 ENCOUNTER — Other Ambulatory Visit: Payer: Self-pay | Admitting: Family

## 2022-11-28 IMAGING — MG MM DIGITAL SCREENING BILAT W/ TOMO AND CAD
6 of 12 series · 6 of 36 positions shown · non-contrast
Comparison: Previous exam(s).

ACR Breast Density Category a: The breast tissue is almost entirely
fatty.

CLINICAL DATA: Screening.

EXAM:
DIGITAL SCREENING BILATERAL MAMMOGRAM WITH TOMOSYNTHESIS AND CAD
TECHNIQUE: Bilateral screening digital craniocaudal and mediolateral oblique
mammograms were obtained. Bilateral screening digital breast
tomosynthesis was performed. The images were evaluated with
computer-aided detection.

[R MLO synth-2D (1 of 2)]
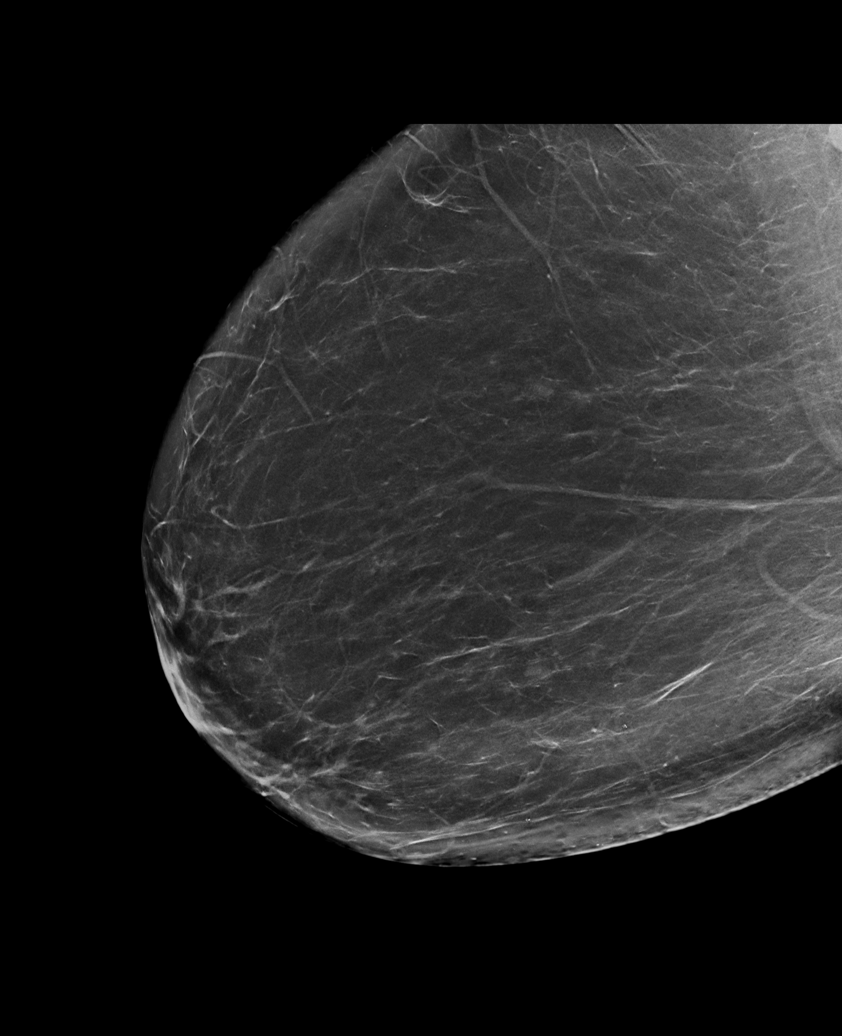

[L MLO synth-2D (1 of 2)]
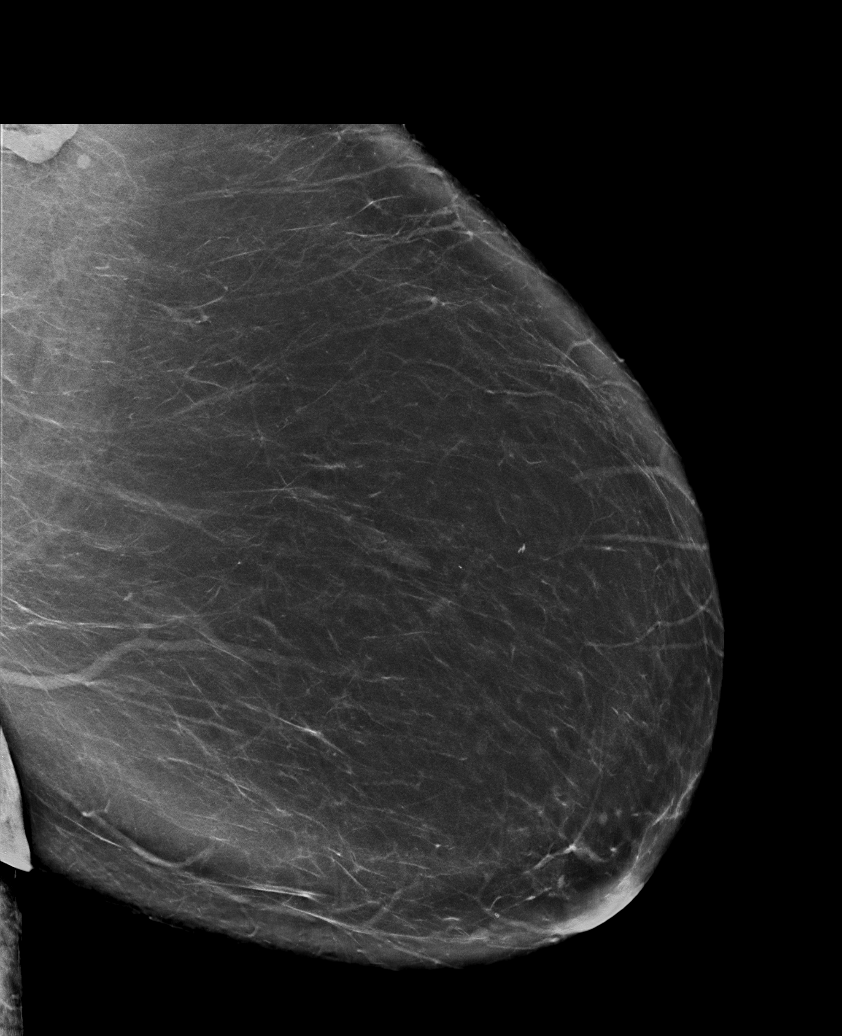

[R CC synth-2D]
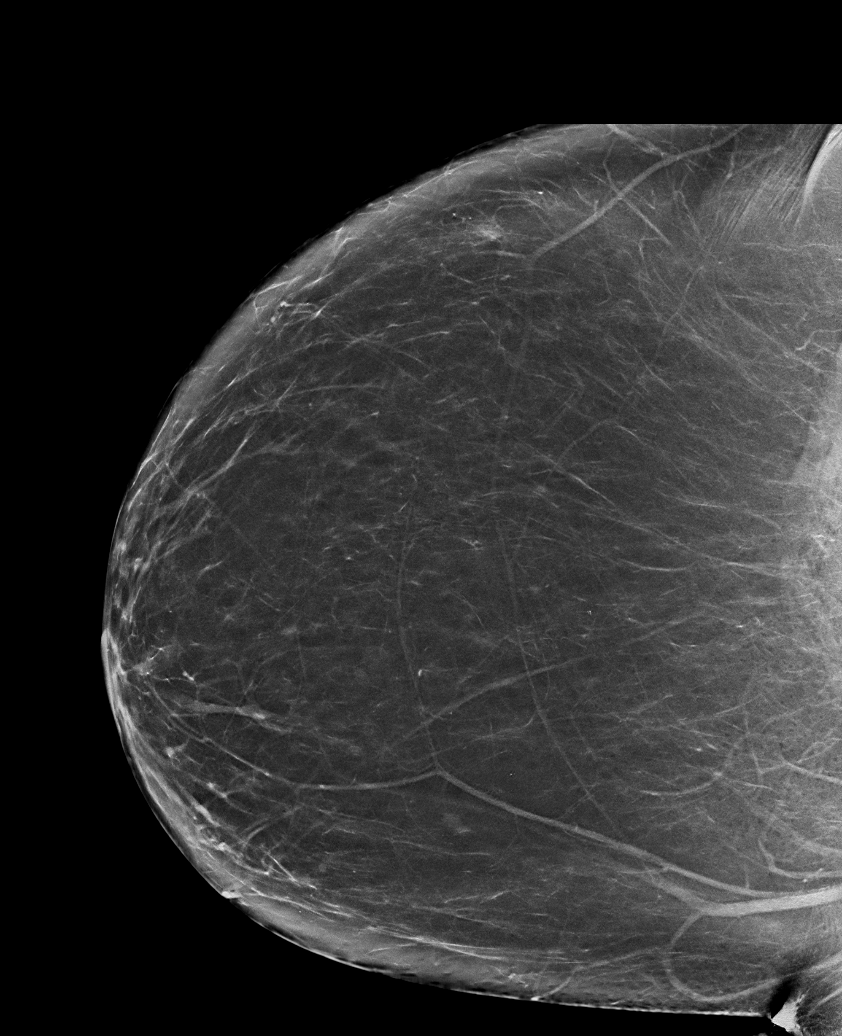

[R MLO synth-2D (2 of 2)]
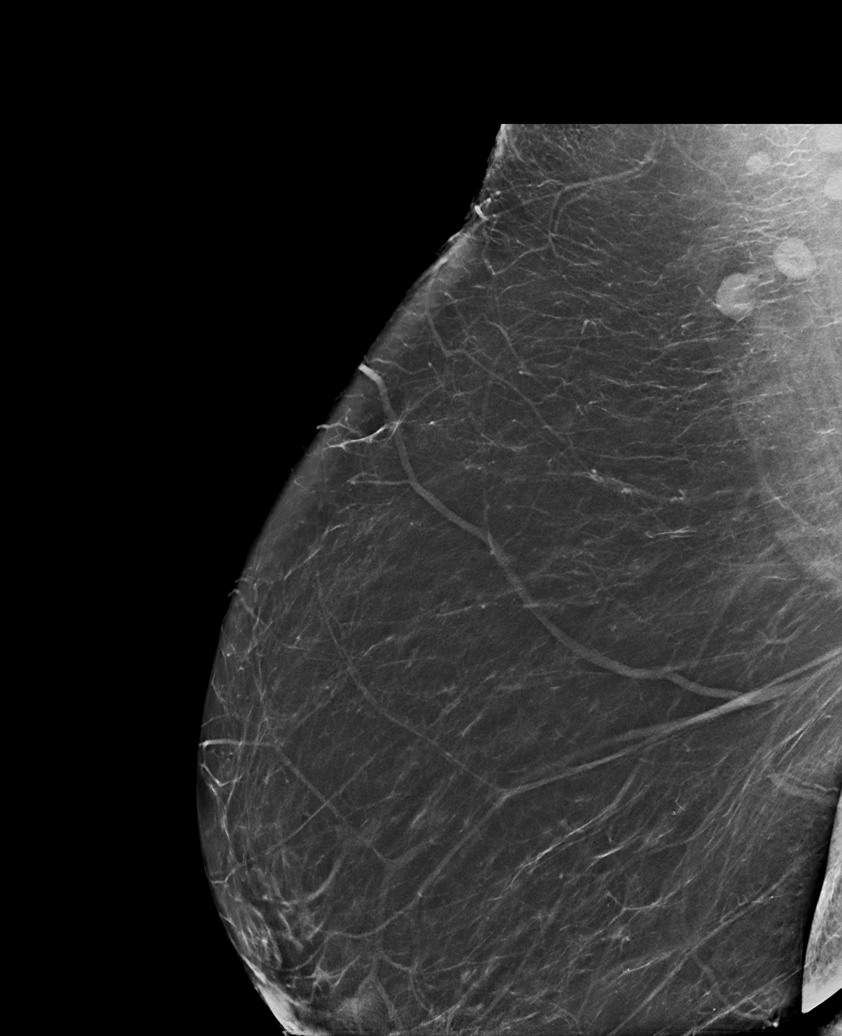

[L CC synth-2D]
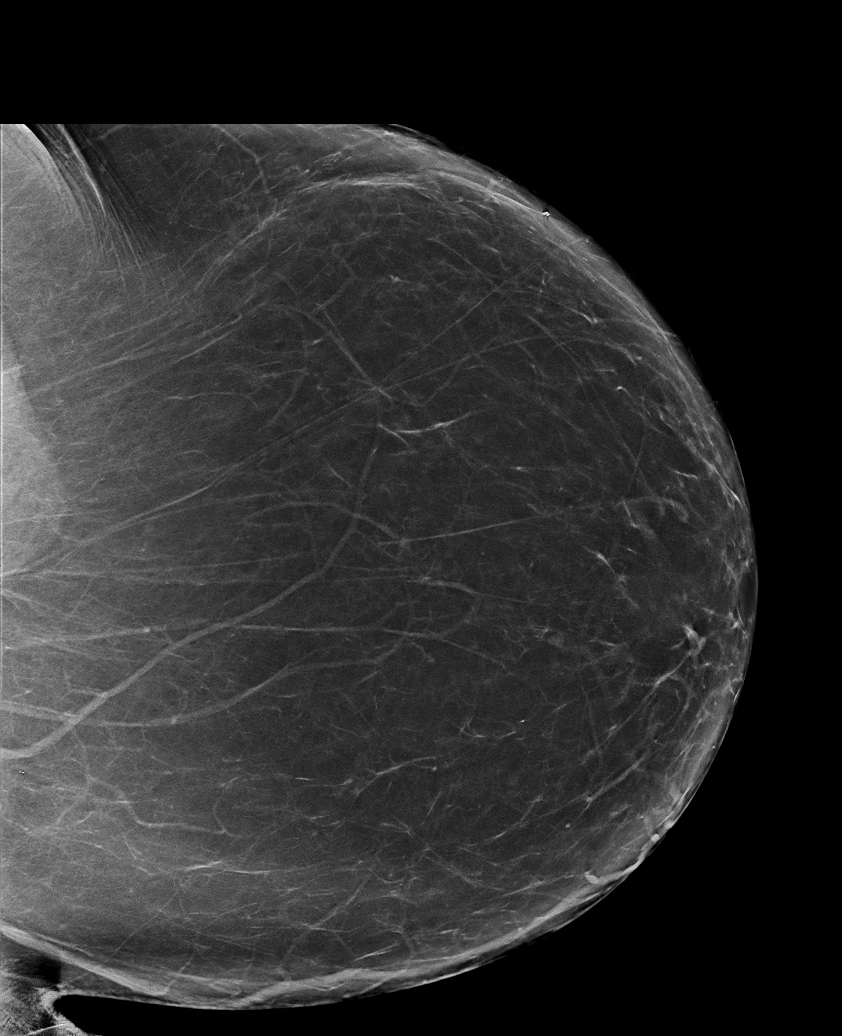

[L MLO synth-2D (2 of 2)]
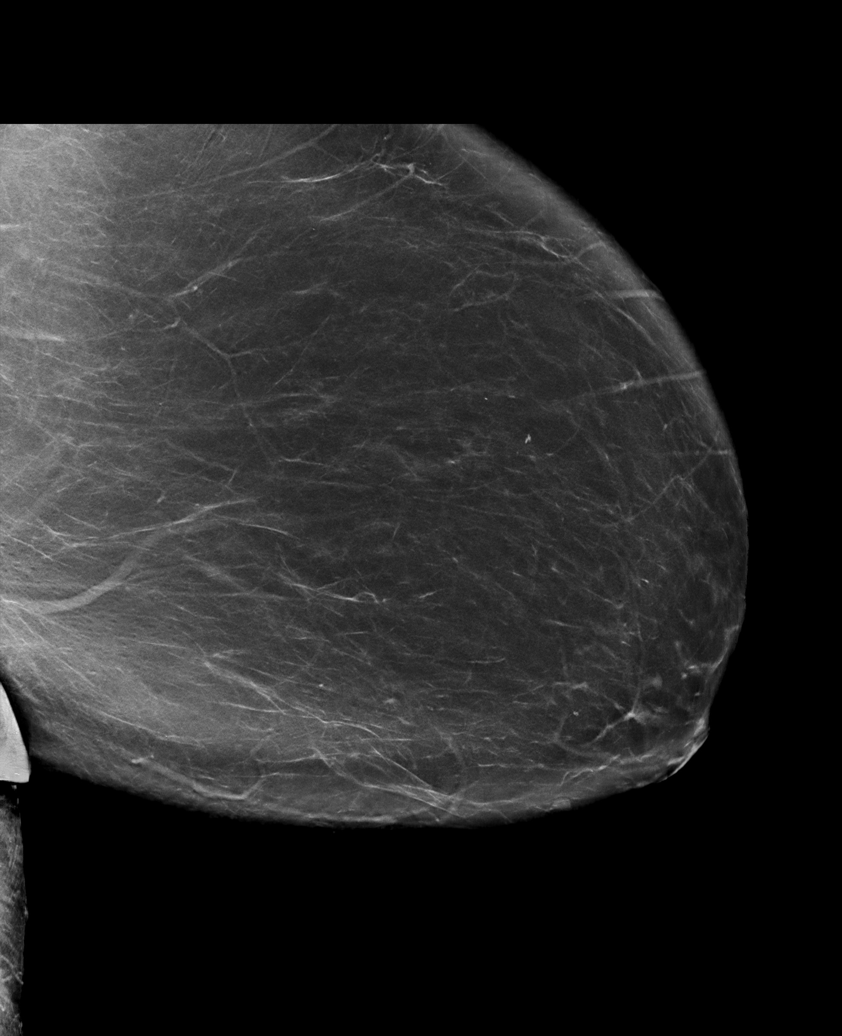

[6 of 36 positions shown; findings below may reference images not displayed]

FINDINGS: There are no findings suspicious for malignancy.
IMPRESSION: No mammographic evidence of malignancy. A result letter of this
screening mammogram will be mailed directly to the patient.

RECOMMENDATION:
Screening mammogram in one year. (Code:[id number]-[id number])

BI-RADS CATEGORY  1: Negative.

## 2023-01-29 ENCOUNTER — Other Ambulatory Visit: Payer: Self-pay | Admitting: Family

## 2023-02-03 ENCOUNTER — Telehealth: Payer: Self-pay | Admitting: Family

## 2023-02-03 NOTE — Telephone Encounter (Signed)
Contacted Katie Simon to schedule their annual wellness visit. Appointment made for 02/11/2023.  Sherol Dade; Care Guide [facility] Direct Dial: [phone]

## 2023-02-11 ENCOUNTER — Ambulatory Visit (INDEPENDENT_AMBULATORY_CARE_PROVIDER_SITE_OTHER): Payer: Medicare Other

## 2023-02-11 VITALS — Ht 59.0 in | Wt 183.0 lb

## 2023-02-11 DIAGNOSIS — Z9289 Personal history of other medical treatment: Secondary | ICD-10-CM

## 2023-02-11 DIAGNOSIS — Z0000 Encounter for general adult medical examination without abnormal findings: Secondary | ICD-10-CM

## 2023-02-11 NOTE — Patient Instructions (Signed)
[name] , Thank you for taking time to come for your Medicare Wellness Visit. I appreciate your ongoing commitment to your health goals. Please review the following plan we discussed and let me know if I can assist you in the future.   These are the goals we discussed:  Goals   None     This is a list of the screening recommended for you and due dates:  Health Maintenance  Topic Date Due   Zoster (Shingles) Vaccine (1 of 2) Never done   Flu Shot  06/02/2023   Medicare Annual Wellness Visit  02/11/2024   Mammogram  04/19/2024   Colon Cancer Screening  09/08/2026   DTaP/Tdap/Td vaccine (3 - Td or Tdap) 08/25/2027   Pneumonia Vaccine  Completed   DEXA scan (bone density measurement)  Completed   Hepatitis C Screening: USPSTF Recommendation to screen - Ages 52-79 yo.  Completed   HPV Vaccine  Aged Out   COVID-19 Vaccine  Discontinued    Advanced directives: Will pick up copy from office to obtain for completion   Conditions/risks identified: Keep up the good work   Next appointment: Follow up in one year for your annual wellness visit    Preventive Care 65 Years and Older, Female Preventive care refers to lifestyle choices and visits with your health care provider that can promote health and wellness. What does preventive care include? A yearly physical exam. This is also called an annual well check. Dental exams once or twice a year. Routine eye exams. Ask your health care provider how often you should have your eyes checked. Personal lifestyle choices, including: Daily care of your teeth and gums. Regular physical activity. Eating a healthy diet. Avoiding tobacco and drug use. Limiting alcohol use. Practicing safe sex. Taking low-dose aspirin every day. Taking vitamin and mineral supplements as recommended by your health care provider. What happens during an annual well check? The services and screenings done by your health care provider during your annual well check  will depend on your age, overall health, lifestyle risk factors, and family history of disease. Counseling  Your health care provider may ask you questions about your: Alcohol use. Tobacco use. Drug use. Emotional well-being. Home and relationship well-being. Sexual activity. Eating habits. History of falls. Memory and ability to understand (cognition). Work and work Astronomer. Reproductive health. Screening  You may have the following tests or measurements: Height, weight, and BMI. Blood pressure. Lipid and cholesterol levels. These may be checked every 5 years, or more frequently if you are over 20 years old. Skin check. Lung cancer screening. You may have this screening every year starting at age 85 if you have a 30-pack-year history of smoking and currently smoke or have quit within the past 15 years. Fecal occult blood test (FOBT) of the stool. You may have this test every year starting at age 31. Flexible sigmoidoscopy or colonoscopy. You may have a sigmoidoscopy every 5 years or a colonoscopy every 10 years starting at age 51. Hepatitis C blood test. Hepatitis B blood test. Sexually transmitted disease (STD) testing. Diabetes screening. This is done by checking your blood sugar (glucose) after you have not eaten for a while (fasting). You may have this done every 1-3 years. Bone density scan. This is done to screen for osteoporosis. You may have this done starting at age 7. Mammogram. This may be done every 1-2 years. Talk to your health care provider about how often you should have regular mammograms. Talk with  your health care provider about your test results, treatment options, and if necessary, the need for more tests. Vaccines  Your health care provider may recommend certain vaccines, such as: Influenza vaccine. This is recommended every year. Tetanus, diphtheria, and acellular pertussis (Tdap, Td) vaccine. You may need a Td booster every 10 years. Zoster vaccine. You  may need this after age 35. Pneumococcal 13-valent conjugate (PCV13) vaccine. One dose is recommended after age 5. Pneumococcal polysaccharide (PPSV23) vaccine. One dose is recommended after age 43. Talk to your health care provider about which screenings and vaccines you need and how often you need them. This information is not intended to replace advice given to you by your health care provider. Make sure you discuss any questions you have with your health care provider. Document Released: 11/14/2015 Document Revised: 07/07/2016 Document Reviewed: 08/19/2015 Elsevier Interactive Patient Education  2017 [employer] Prevention in the Home Falls can cause injuries. They can happen to people of all ages. There are many things you can do to make your home safe and to help prevent falls. What can I do on the outside of my home? Regularly fix the edges of walkways and driveways and fix any cracks. Remove anything that might make you trip as you walk through a door, such as a raised step or threshold. Trim any bushes or trees on the path to your home. Use bright outdoor lighting. Clear any walking paths of anything that might make someone trip, such as rocks or tools. Regularly check to see if handrails are loose or broken. Make sure that both sides of any steps have handrails. Any raised decks and porches should have guardrails on the edges. Have any leaves, snow, or ice cleared regularly. Use sand or salt on walking paths during winter. Clean up any spills in your garage right away. This includes oil or grease spills. What can I do in the bathroom? Use night lights. Install grab bars by the toilet and in the tub and shower. Do not use towel bars as grab bars. Use non-skid mats or decals in the tub or shower. If you need to sit down in the shower, use a plastic, non-slip stool. Keep the floor dry. Clean up any water that spills on the floor as soon as it happens. Remove soap buildup  in the tub or shower regularly. Attach bath mats securely with double-sided non-slip rug tape. Do not have throw rugs and other things on the floor that can make you trip. What can I do in the bedroom? Use night lights. Make sure that you have a light by your bed that is easy to reach. Do not use any sheets or blankets that are too big for your bed. They should not hang down onto the floor. Have a firm chair that has side arms. You can use this for support while you get dressed. Do not have throw rugs and other things on the floor that can make you trip. What can I do in the kitchen? Clean up any spills right away. Avoid walking on wet floors. Keep items that you use a lot in easy-to-reach places. If you need to reach something above you, use a strong step stool that has a grab bar. Keep electrical cords out of the way. Do not use floor polish or wax that makes floors slippery. If you must use wax, use non-skid floor wax. Do not have throw rugs and other things on the floor that can make you trip.  What can I do with my stairs? Do not leave any items on the stairs. Make sure that there are handrails on both sides of the stairs and use them. Fix handrails that are broken or loose. Make sure that handrails are as long as the stairways. Check any carpeting to make sure that it is firmly attached to the stairs. Fix any carpet that is loose or worn. Avoid having throw rugs at the top or bottom of the stairs. If you do have throw rugs, attach them to the floor with carpet tape. Make sure that you have a light switch at the top of the stairs and the bottom of the stairs. If you do not have them, ask someone to add them for you. What else can I do to help prevent falls? Wear shoes that: Do not have high heels. Have rubber bottoms. Are comfortable and fit you well. Are closed at the toe. Do not wear sandals. If you use a stepladder: Make sure that it is fully opened. Do not climb a closed  stepladder. Make sure that both sides of the stepladder are locked into place. Ask someone to hold it for you, if possible. Clearly mark and make sure that you can see: Any grab bars or handrails. First and last steps. Where the edge of each step is. Use tools that help you move around (mobility aids) if they are needed. These include: Canes. Walkers. Scooters. Crutches. Turn on the lights when you go into a dark area. Replace any light bulbs as soon as they burn out. Set up your furniture so you have a clear path. Avoid moving your furniture around. If any of your floors are uneven, fix them. If there are any pets around you, be aware of where they are. Review your medicines with your doctor. Some medicines can make you feel dizzy. This can increase your chance of falling. Ask your doctor what other things that you can do to help prevent falls. This information is not intended to replace advice given to you by your health care provider. Make sure you discuss any questions you have with your health care provider. Document Released: 08/14/2009 Document Revised: 03/25/2016 Document Reviewed: 11/22/2014 Elsevier Interactive Patient Education  2017 [employer].

## 2023-02-11 NOTE — Progress Notes (Signed)
Subjective:   Katie Simon is a 67 y.o. female who presents for Medicare Annual (Subsequent) preventive examination.  I connected with  Virgilio Frees on 02/11/23 by a audio enabled telemedicine application and verified that I am speaking with the correct person using two identifiers.  Patient Location: Home  Provider Location: Home Office  I discussed the limitations of evaluation and management by telemedicine. The patient expressed understanding and agreed to proceed.  Review of Systems           Objective:    There were no vitals filed for this visit. There is no height or weight on file to calculate BMI.     02/09/2022    1:42 PM 08/24/2017    5:09 PM 08/24/2017   12:30 PM 08/11/2016   12:53 PM 07/14/2015   11:29 PM 07/14/2015    3:16 PM  Advanced Directives  Does Patient Have a Medical Advance Directive? No No No No  No  Would patient like information on creating a medical advance directive? No - Patient declined   No - patient declined information  Yes - Educational materials given     Information is confidential and restricted. Go to Review Flowsheets to unlock data.     Current Medications (verified) Outpatient Encounter Medications as of 02/11/2023  Medication Sig   fluticasone (FLONASE) 50 MCG/ACT nasal spray Place 2 sprays into both nostrils daily.   hydrochlorothiazide (HYDRODIURIL) 25 MG tablet Take 1 tablet by mouth once daily   metFORMIN (GLUCOPHAGE-XR) 500 MG 24 hr tablet Take 1 tablet (500 mg total) by mouth daily with breakfast.   rosuvastatin (CRESTOR) 10 MG tablet Take 1 tablet (10 mg total) by mouth daily.   No facility-administered encounter medications on file as of 02/11/2023.    Allergies (verified) Patient has no known allergies.   History: Past Medical History:  Diagnosis Date   Anemia    hx of   Anxiety    Arthritis    RIGHT knee, lower back   Blood transfusion without reported diagnosis 2003   with hysterecomty    Cataract    RIGHT eye- not a surgical candidate at this time (08/25/2021)   Depression    Diabetes mellitus without complication (HCC)    on meds   Hyperlipidemia    on meds   Hypertension    on meds   PTSD (post-traumatic stress disorder)    Seasonal allergies    Past Surgical History:  Procedure Laterality Date   ABDOMINAL HYSTERECTOMY  2003   COLONOSCOPY  2011   per pt- with Dr. Loreta Ave   TUBAL LIGATION  1990   Family History  Problem Relation Age of Onset   Colon cancer Mother 5   Colon polyps Mother 70   Diabetes Mother    Aneurysm Mother    Prostate cancer Father    Diabetes Father    Kidney disease Father    Stroke Father    Stomach cancer Sister 16   Ovarian cancer Sister    Diabetes Sister    Colon polyps Brother    Diabetes Brother    Kidney disease Brother    Colon polyps Brother 49   Diabetes Brother    Early death Brother    Bone cancer Brother    Heart disease Brother    Kidney cancer Brother    Esophageal cancer Neg Hx    Rectal cancer Neg Hx    Social History   Socioeconomic History   Marital status: Single  Spouse name: Not on file   Number of children: Not on file   Years of education: Not on file   Highest education level: Not on file  Occupational History   Not on file  Tobacco Use   Smoking status: Never   Smokeless tobacco: Never  Vaping Use   Vaping Use: Never used  Substance and Sexual Activity   Alcohol use: No   Drug use: No   Sexual activity: Not on file  Other Topics Concern   Not on file  Social History Narrative   Not on file   Social Determinants of Health   Financial Resource Strain: Low Risk  (02/09/2022)   Overall Financial Resource Strain (CARDIA)    Difficulty of Paying Living Expenses: Not hard at all  Food Insecurity: No Food Insecurity (02/09/2022)   Hunger Vital Sign    Worried About Running Out of Food in the Last Year: Never true    Ran Out of Food in the Last Year: Never true  Transportation Needs:  No Transportation Needs (02/09/2022)   PRAPARE - Administrator, Civil Service (Medical): No    Lack of Transportation (Non-Medical): No  Physical Activity: Insufficiently Active (02/09/2022)   Exercise Vital Sign    Days of Exercise per Week: 3 days    Minutes of Exercise per Session: 20 min  Stress: No Stress Concern Present (02/09/2022)   [employer] of Occupational Health - Occupational Stress Questionnaire    Feeling of Stress : Not at all  Social Connections: Moderately Isolated (02/09/2022)   Social Connection and Isolation Panel [NHANES]    Frequency of Communication with Friends and Family: More than three times a week    Frequency of Social Gatherings with Friends and Family: More than three times a week    Attends Religious Services: 1 to 4 times per year    Active Member of [employer] or Organizations: No    Attends Banker Meetings: Never    Marital Status: Never married    Tobacco Counseling Counseling given: Not Answered   Clinical Intake:              How often do you need to have someone help you when you read instructions, pamphlets, or other written materials from your doctor or pharmacy?: (P) 1 - Never  Diabetic?  Yes         Activities of Daily Living    02/04/2023    8:50 AM  In your present state of health, do you have any difficulty performing the following activities:  Hearing? 0  Vision? 0  Difficulty concentrating or making decisions? 0  Walking or climbing stairs? 0  Dressing or bathing? 0  Doing errands, shopping? 0  Preparing Food and eating ? N  Using the Toilet? N  In the past six months, have you accidently leaked urine? N  Do you have problems with loss of bowel control? N  Managing your Medications? N  Managing your Finances? N  Housekeeping or managing your Housekeeping? N    Patient Care Team: Olive Bass, FNP as PCP - General (Internal Medicine)  Indicate any recent Medical Services  you may have received from other than [facility] providers in the past year (date may be approximate).     Assessment:   This is a routine wellness examination for Katie Simon.  Hearing/Vision screen No results found.  Dietary issues and exercise activities discussed:     Goals Addressed   None  Depression Screen    04/13/2022    9:36 AM 02/16/2022    1:04 PM 02/09/2022    1:40 PM 06/16/2021    9:38 AM 03/12/2021   10:16 AM  PHQ 2/9 Scores  PHQ - 2 Score 3 1 1  0 1  PHQ- 9 Score 5        Fall Risk    02/04/2023    8:50 AM 02/16/2022    1:04 PM 02/09/2022    1:43 PM 06/16/2021    9:38 AM 03/12/2021   10:16 AM  Fall Risk   Falls in the past year? 0 0 0 0 0  Number falls in past yr: 0 0 0 0 0  Injury with Fall? 0 0 0 0 0  Risk for fall due to :  No Fall Risks Impaired vision No Fall Risks No Fall Risks  Follow up  Falls evaluation completed Falls prevention discussed Falls evaluation completed Falls evaluation completed    FALL RISK PREVENTION PERTAINING TO THE HOME:  Any stairs in or around the home? Yes  If so, are there any without handrails? No Home free of loose throw rugs in walkways, pet beds, electrical cords, etc? Yes  Adequate lighting in your home to reduce risk of falls? Yes   ASSISTIVE DEVICES UTILIZED TO PREVENT FALLS:  Life alert? No Use of a cane, walker or w/c? No  Grab bars in the bathroom? No  Shower chair or bench in shower? No  Elevated toilet seat or a handicapped toilet? No   TIMED UP AND GO:  Was the test performed? No .  Televisit   Cognitive Function:        Immunizations Immunization History  Administered Date(s) Administered   PFIZER(Purple Top)SARS-COV-2 Vaccination 01/25/2020, 02/18/2020, 11/06/2020   PNEUMOCOCCAL CONJUGATE-20 03/12/2021   Tdap 02/10/2012, 08/24/2017    TDAP status: Up to date  Flu Vaccine status: Up to date  Pneumococcal vaccine status: Up to date  Covid-19 vaccine status: Completed vaccines  Qualifies for  Shingles Vaccine? Yes   Zostavax completed No   Shingrix Completed?: No.    Education has been provided regarding the importance of this vaccine. Patient has been advised to call insurance company to determine out of pocket expense if they have not yet received this vaccine. Advised may also receive vaccine at local pharmacy or Health Dept. Verbalized acceptance and understanding.  Screening Tests Health Maintenance  Topic Date Due   Zoster Vaccines- Shingrix (1 of 2) Never done   INFLUENZA VACCINE  06/02/2023   Medicare Annual Wellness (AWV)  02/11/2024   MAMMOGRAM  04/19/2024   COLONOSCOPY (Pts 45-74yrs Insurance coverage will need to be confirmed)  09/08/2026   DTaP/Tdap/Td (3 - Td or Tdap) 08/25/2027   Pneumonia Vaccine 75+ Years old  Completed   DEXA SCAN  Completed   Hepatitis C Screening  Completed   HPV VACCINES  Aged Out   COVID-19 Vaccine  Discontinued    Health Maintenance  Health Maintenance Due  Topic Date Due   Zoster Vaccines- Shingrix (1 of 2) Never done    Colorectal cancer screening: Type of screening: Colonoscopy. Completed 09/08/2021. Repeat every 10 years  Mammogram status: Completed 04/19/22. Repeat every year  Bone Density status: Ordered Yes . Pt provided with contact info and advised to call to schedule appt.  Lung Cancer Screening: (Low Dose CT Chest recommended if Age 51-80 years, 30 pack-year currently smoking OR have quit w/in 15years.) does not qualify.   Lung Cancer Screening  Referral: N/A  Additional Screening:  Hepatitis C Screening: does qualify; Completed 03/12/2021  Vision Screening: Recommended annual ophthalmology exams for early detection of glaucoma and other disorders of the eye. Is the patient up to date with their annual eye exam?  Yes  Who is the provider or what is the name of the office in which the patient attends annual eye exams?    [facility] on [address]  If pt is not established with a provider, would they like to be referred  to a provider to establish care? No .   Dental Screening: Recommended annual dental exams for proper oral hygiene  Community Resource Referral / Chronic Care Management: CRR required this visit?  No   CCM required this visit?  No      Plan:     I have personally reviewed and noted the following in the patient's chart:   Medical and social history Use of alcohol, tobacco or illicit drugs  Current medications and supplements including opioid prescriptions. Patient is not currently taking opioid prescriptions. Functional ability and status Nutritional status Physical activity Advanced directives List of other physicians Hospitalizations, surgeries, and ER visits in previous 12 months Vitals Screenings to include cognitive, depression, and falls Referrals and appointments  In addition, I have reviewed and discussed with patient certain preventive protocols, quality metrics, and best practice recommendations. A written personalized care plan for preventive services as well as general preventive health recommendations were provided to patient.     Milus Mallick, CMA   02/11/2023   Nurse Notes: Order entered for Bone density, per patient request.   Noted on order to schedule after 04/16/23

## 2023-03-02 NOTE — Progress Notes (Addendum)
Subjective:   Katie Simon is a 67 y.o. female who presents for Medicare Annual (Subsequent) preventive examination.  I connected with  Virgilio Frees on 02/11/2023 by a audio enabled telemedicine application and verified that I am speaking with the correct person using two identifiers.  Patient Location: Home  Provider Location: Home Office  I discussed the limitations of evaluation and management by telemedicine. The patient expressed understanding and agreed to proceed.  Review of Systems     Cardiac Risk Factors include: advanced age (>44men, >38 women);diabetes mellitus;dyslipidemia;hypertension;obesity (BMI >30kg/m2)     Objective:    Today's Vitals   02/11/23 1228  Weight: 183 lb (83 kg)  Height: 4\' 11"  (1.499 m)   Body mass index is 36.96 kg/m.     02/11/2023   12:37 PM 02/09/2022    1:42 PM 08/24/2017    5:09 PM 08/24/2017   12:30 PM 08/11/2016   12:53 PM 07/14/2015   11:29 PM 07/14/2015    3:16 PM  Advanced Directives  Does Patient Have a Medical Advance Directive? No No No No No  No  Would patient like information on creating a medical advance directive? No - Patient declined No - Patient declined   No - patient declined information  Yes - Educational materials given     Information is confidential and restricted. Go to Review Flowsheets to unlock data.    Current Medications (verified) Outpatient Encounter Medications as of 02/11/2023  Medication Sig   hydrochlorothiazide (HYDRODIURIL) 25 MG tablet Take 1 tablet by mouth once daily   metFORMIN (GLUCOPHAGE-XR) 500 MG 24 hr tablet Take 1 tablet (500 mg total) by mouth daily with breakfast.   fluticasone (FLONASE) 50 MCG/ACT nasal spray Place 2 sprays into both nostrils daily. (Patient not taking: Reported on 02/11/2023)   rosuvastatin (CRESTOR) 10 MG tablet Take 1 tablet (10 mg total) by mouth daily. (Patient not taking: Reported on 02/11/2023)   No facility-administered encounter medications on file  as of 02/11/2023.    Allergies (verified) Patient has no known allergies.   History: Past Medical History:  Diagnosis Date   Anemia    hx of   Anxiety    Arthritis    RIGHT knee, lower back   Blood transfusion without reported diagnosis 2003   with hysterecomty   Cataract    RIGHT eye- not a surgical candidate at this time (08/25/2021)   Depression    Diabetes mellitus without complication (HCC)    on meds   Hyperlipidemia    on meds   Hypertension    on meds   PTSD (post-traumatic stress disorder)    Seasonal allergies    Past Surgical History:  Procedure Laterality Date   ABDOMINAL HYSTERECTOMY  2003   COLONOSCOPY  2011   per pt- with Dr. Loreta Ave   TUBAL LIGATION  1990   Family History  Problem Relation Age of Onset   Colon cancer Mother 31   Colon polyps Mother 52   Diabetes Mother    Aneurysm Mother    Prostate cancer Father    Diabetes Father    Kidney disease Father    Stroke Father    Stomach cancer Sister 17   Ovarian cancer Sister    Diabetes Sister    Colon polyps Brother    Diabetes Brother    Kidney disease Brother    Colon polyps Brother 78   Diabetes Brother    Early death Brother    Bone cancer Brother  Heart disease Brother    Kidney cancer Brother    Esophageal cancer Neg Hx    Rectal cancer Neg Hx    Social History   Socioeconomic History   Marital status: Single    Spouse name: Not on file   Number of children: Not on file   Years of education: Not on file   Highest education level: Not on file  Occupational History   Not on file  Tobacco Use   Smoking status: Never   Smokeless tobacco: Never  Vaping Use   Vaping Use: Never used  Substance and Sexual Activity   Alcohol use: No   Drug use: No   Sexual activity: Not Currently  Other Topics Concern   Not on file  Social History Narrative   Not on file   Social Determinants of Health   Financial Resource Strain: Low Risk  (02/11/2023)   Overall Financial Resource  Strain (CARDIA)    Difficulty of Paying Living Expenses: Not hard at all  Food Insecurity: No Food Insecurity (02/11/2023)   Hunger Vital Sign    Worried About Running Out of Food in the Last Year: Never true    Ran Out of Food in the Last Year: Never true  Transportation Needs: No Transportation Needs (02/11/2023)   PRAPARE - Administrator, Civil Service (Medical): No    Lack of Transportation (Non-Medical): No  Physical Activity: Insufficiently Active (02/11/2023)   Exercise Vital Sign    Days of Exercise per Week: 2 days    Minutes of Exercise per Session: 20 min  Stress: No Stress Concern Present (02/11/2023)   [employer] of Occupational Health - Occupational Stress Questionnaire    Feeling of Stress : Only a little  Social Connections: Socially Integrated (02/11/2023)   Social Connection and Isolation Panel [NHANES]    Frequency of Communication with Friends and Family: More than three times a week    Frequency of Social Gatherings with Friends and Family: More than three times a week    Attends Religious Services: More than 4 times per year    Active Member of [employer] or Organizations: Yes    Attends Engineer, structural: More than 4 times per year    Marital Status: Married    Tobacco Counseling Counseling given: Not Answered   Clinical Intake:  Pre-visit preparation completed: Yes  Pain : No/denies pain     BMI - recorded: 36.96 Nutritional Status: BMI > 30  Obese Nutritional Risks: None Diabetes: No CBG done?: No Did pt. bring in CBG monitor from home?: No  How often do you need to have someone help you when you read instructions, pamphlets, or other written materials from your doctor or pharmacy?: 1 - Never  Diabetic?  No  Interpreter Needed?: No  Information entered by :: Kandis Cocking, CMA   Activities of Daily Living    02/11/2023   12:40 PM 02/04/2023    8:50 AM  In your present state of health, do you have any difficulty  performing the following activities:  Hearing? 0 0  Vision? 0 0  Difficulty concentrating or making decisions? 0 0  Walking or climbing stairs? 0 0  Dressing or bathing? 0 0  Doing errands, shopping? 0 0  Preparing Food and eating ? N N  Using the Toilet? N N  In the past six months, have you accidently leaked urine? N N  Do you have problems with loss of bowel control? N N  Managing your Medications? N N  Managing your Finances? N N  Housekeeping or managing your Housekeeping? N N    Patient Care Team: Olive Bass, FNP as PCP - General (Internal Medicine)  Indicate any recent Medical Services you may have received from other than [facility] providers in the past year (date may be approximate).     Assessment:   This is a routine wellness examination for Annastacia.  Hearing/Vision screen Hearing Screening - Comments:: Denies hearing difficulties   Vision Screening - Comments:: Wears rx glasses - up to date with routine eye exams with  August 2023-[facility] on [address]  Dietary issues and exercise activities discussed: Current Exercise Habits: Home exercise routine, Time (Minutes): 20, Frequency (Times/Week): 2, Weekly Exercise (Minutes/Week): 40, Intensity: Mild, Exercise limited by: None identified   Goals Addressed             This Visit's Progress    COMPLETED: Patient Stated       Lose weight, exercise more      Depression Screen    02/11/2023   12:45 PM 04/13/2022    9:36 AM 02/16/2022    1:04 PM 02/09/2022    1:40 PM 06/16/2021    9:38 AM 03/12/2021   10:16 AM  PHQ 2/9 Scores  PHQ - 2 Score 1 3 1 1  0 1  PHQ- 9 Score 1 5        Fall Risk    02/11/2023    1:36 PM 02/04/2023    8:50 AM 02/16/2022    1:04 PM 02/09/2022    1:43 PM 06/16/2021    9:38 AM  Fall Risk   Falls in the past year? 0 0 0 0 0  Number falls in past yr: 0 0 0 0 0  Injury with Fall? 0 0 0 0 0  Risk for fall due to : No Fall Risks No Fall Risks No Fall Risks Impaired vision No Fall  Risks  Follow up Falls prevention discussed Falls evaluation completed;Falls prevention discussed Falls evaluation completed Falls prevention discussed Falls evaluation completed    FALL RISK PREVENTION PERTAINING TO THE HOME:  Any stairs in or around the home? Yes  If so, are there any without handrails? No Home free of loose throw rugs in walkways, pet beds, electrical cords, etc? No  Adequate lighting in your home to reduce risk of falls? No   ASSISTIVE DEVICES UTILIZED TO PREVENT FALLS:  Life alert? No Use of a cane, walker or w/c? No  Grab bars in the bathroom? No  Shower chair or bench in shower? No  Elevated toilet seat or a handicapped toilet? No   TIMED UP AND GO:  Was the test performed? No .  Televisit   Cognitive Function:        02/11/2023   12:42 PM  6CIT Screen  What Year? 0 points  What month? 0 points  What time? 0 points  Count back from 20 0 points  Months in reverse 0 points  Repeat phrase 2 points  Total Score 2 points    Immunizations Immunization History  Administered Date(s) Administered   PFIZER(Purple Top)SARS-COV-2 Vaccination 01/25/2020, 02/18/2020, 11/06/2020   PNEUMOCOCCAL CONJUGATE-20 03/12/2021   Tdap 02/10/2012, 08/24/2017    TDAP status: Up to date  Flu Vaccine status: Up to date  Pneumococcal vaccine status: Up to date  Covid-19 vaccine status: Completed vaccines  Qualifies for Shingles Vaccine? Yes   Zostavax completed No   Shingrix Completed?: No.  Education has been provided regarding the importance of this vaccine. Patient has been advised to call insurance company to determine out of pocket expense if they have not yet received this vaccine. Advised may also receive vaccine at local pharmacy or Health Dept. Verbalized acceptance and understanding.  Screening Tests Health Maintenance  Topic Date Due   Zoster Vaccines- Shingrix (1 of 2) Never done   INFLUENZA VACCINE  06/02/2023   Medicare Annual Wellness (AWV)   02/11/2024   MAMMOGRAM  04/19/2024   COLONOSCOPY (Pts 45-34yrs Insurance coverage will need to be confirmed)  09/08/2026   DTaP/Tdap/Td (3 - Td or Tdap) 08/25/2027   Pneumonia Vaccine 47+ Years old  Completed   DEXA SCAN  Completed   Hepatitis C Screening  Completed   HPV VACCINES  Aged Out   COVID-19 Vaccine  Discontinued    Health Maintenance  Health Maintenance Due  Topic Date Due   Zoster Vaccines- Shingrix (1 of 2) Never done    Colorectal cancer screening: Type of screening: Colonoscopy. Completed 09/08/2021. Repeat every 10 years  Mammogram status: Completed 04/19/22. Repeat every year  Bone Density status: Ordered Yes . Pt provided with contact info and advised to call to schedule appt.  Lung Cancer Screening: (Low Dose CT Chest recommended if Age 40-80 years, 30 pack-year currently smoking OR have quit w/in 15years.) does not qualify.   Lung Cancer Screening Referral: N/A  Additional Screening:  Hepatitis C Screening: does qualify; Completed 03/12/2021  Vision Screening: Recommended annual ophthalmology exams for early detection of glaucoma and other disorders of the eye. Is the patient up to date with their annual eye exam?  Yes  Who is the provider or what is the name of the office in which the patient attends annual eye exams?    [facility] on [address]  If pt is not established with a provider, would they like to be referred to a provider to establish care? No .   Dental Screening: Recommended annual dental exams for proper oral hygiene  Community Resource Referral / Chronic Care Management: CRR required this visit?  No   CCM required this visit?  No      Plan:     I have personally reviewed and noted the following in the patient's chart:   Medical and social history Use of alcohol, tobacco or illicit drugs  Current medications and supplements including opioid prescriptions. Patient is not currently taking opioid prescriptions. Functional ability and  status Nutritional status Physical activity Advanced directives List of other physicians Hospitalizations, surgeries, and ER visits in previous 12 months Vitals Screenings to include cognitive, depression, and falls Referrals and appointments  In addition, I have reviewed and discussed with patient certain preventive protocols, quality metrics, and best practice recommendations. A written personalized care plan for preventive services as well as general preventive health recommendations were provided to patient.     Kandis Cocking, CMA  02/11/2023   Nurse Notes: Order entered for Bone density, per patient request.   Noted on order to schedule after 04/16/23

## 2023-03-08 ENCOUNTER — Ambulatory Visit (HOSPITAL_BASED_OUTPATIENT_CLINIC_OR_DEPARTMENT_OTHER)
Admission: RE | Admit: 2023-03-08 | Discharge: 2023-03-08 | Disposition: A | Discharge status: Home / Self Care | Payer: Medicare Other | Source: Ambulatory Visit | Attending: Family | Admitting: Family

## 2023-03-08 DIAGNOSIS — Z9289 Personal history of other medical treatment: Secondary | ICD-10-CM

## 2023-03-29 ENCOUNTER — Ambulatory Visit (HOSPITAL_BASED_OUTPATIENT_CLINIC_OR_DEPARTMENT_OTHER)
Admission: RE | Admit: 2023-03-29 | Discharge: 2023-03-29 | Disposition: A | Discharge status: Home / Self Care | Payer: Medicare Other | Source: Ambulatory Visit | Attending: Family | Admitting: Family

## 2023-03-29 DIAGNOSIS — Z13820 Encounter for screening for osteoporosis: Secondary | ICD-10-CM | POA: Insufficient documentation

## 2023-03-29 DIAGNOSIS — Z9289 Personal history of other medical treatment: Secondary | ICD-10-CM | POA: Insufficient documentation

## 2023-03-29 DIAGNOSIS — Z780 Asymptomatic menopausal state: Secondary | ICD-10-CM | POA: Diagnosis not present

## 2023-03-29 DIAGNOSIS — Z90710 Acquired absence of both cervix and uterus: Secondary | ICD-10-CM | POA: Diagnosis not present

## 2023-04-19 ENCOUNTER — Ambulatory Visit (INDEPENDENT_AMBULATORY_CARE_PROVIDER_SITE_OTHER): Payer: Medicare Other | Admitting: Family

## 2023-04-19 ENCOUNTER — Encounter: Payer: Self-pay | Admitting: Family

## 2023-04-19 VITALS — BP 138/86 | HR 75 | Temp 98.5°F | Ht 59.0 in | Wt 188.6 lb

## 2023-04-19 DIAGNOSIS — Z1231 Encounter for screening mammogram for malignant neoplasm of breast: Secondary | ICD-10-CM | POA: Diagnosis not present

## 2023-04-19 DIAGNOSIS — Z7984 Long term (current) use of oral hypoglycemic drugs: Secondary | ICD-10-CM

## 2023-04-19 DIAGNOSIS — Z794 Long term (current) use of insulin: Secondary | ICD-10-CM | POA: Diagnosis not present

## 2023-04-19 DIAGNOSIS — E119 Type 2 diabetes mellitus without complications: Secondary | ICD-10-CM | POA: Diagnosis not present

## 2023-04-19 DIAGNOSIS — Z13220 Encounter for screening for lipoid disorders: Secondary | ICD-10-CM

## 2023-04-19 DIAGNOSIS — Z0000 Encounter for general adult medical examination without abnormal findings: Secondary | ICD-10-CM | POA: Diagnosis not present

## 2023-04-19 LAB — COMPREHENSIVE METABOLIC PANEL WITH GFR
ALT: 9 U/L (ref 0–35)
AST: 16 U/L (ref 0–37)
Albumin: 4 g/dL (ref 3.5–5.2)
Alkaline Phosphatase: 72 U/L (ref 39–117)
BUN: 8 mg/dL (ref 6–23)
CO2: 26 meq/L (ref 19–32)
Calcium: 9.2 mg/dL (ref 8.4–10.5)
Chloride: 105 meq/L (ref 96–112)
Creatinine, Ser: 0.89 mg/dL (ref 0.40–1.20)
GFR: 66.83 mL/min
Glucose, Bld: 120 mg/dL — ABNORMAL HIGH (ref 70–99)
Potassium: 4.4 meq/L (ref 3.5–5.1)
Sodium: 141 meq/L (ref 135–145)
Total Bilirubin: 0.6 mg/dL (ref 0.2–1.2)
Total Protein: 7.4 g/dL (ref 6.0–8.3)

## 2023-04-19 LAB — CBC WITH DIFFERENTIAL/PLATELET
Basophils Absolute: 0 10*3/uL (ref 0.0–0.1)
Basophils Relative: 0.5 % (ref 0.0–3.0)
Eosinophils Absolute: 0.2 10*3/uL (ref 0.0–0.7)
Eosinophils Relative: 4.1 % (ref 0.0–5.0)
HCT: 40.4 % (ref 36.0–46.0)
Hemoglobin: 12.8 g/dL (ref 12.0–15.0)
Lymphocytes Relative: 41.7 % (ref 12.0–46.0)
Lymphs Abs: 2.3 10*3/uL (ref 0.7–4.0)
MCHC: 31.7 g/dL (ref 30.0–36.0)
MCV: 83.1 fl (ref 78.0–100.0)
Monocytes Absolute: 0.4 10*3/uL (ref 0.1–1.0)
Monocytes Relative: 6.7 % (ref 3.0–12.0)
Neutro Abs: 2.6 10*3/uL (ref 1.4–7.7)
Neutrophils Relative %: 47 % (ref 43.0–77.0)
Platelets: 274 10*3/uL (ref 150.0–400.0)
RBC: 4.86 Mil/uL (ref 3.87–5.11)
RDW: 15.1 % (ref 11.5–15.5)
WBC: 5.5 10*3/uL (ref 4.0–10.5)

## 2023-04-19 LAB — LIPID PANEL
Cholesterol: 157 mg/dL (ref 0–200)
HDL: 45 mg/dL (ref >39.00)
LDL Cholesterol: 84 mg/dL (ref 0–99)
NonHDL: 112.27
Total CHOL/HDL Ratio: 3
Triglycerides: 140 mg/dL (ref 0.0–149.0)
VLDL: 28 mg/dL (ref 0.0–40.0)

## 2023-04-19 LAB — TSH: TSH: 3.93 u[IU]/mL (ref 0.35–5.50)

## 2023-04-19 LAB — HEMOGLOBIN A1C: Hgb A1c MFr Bld: 7.6 % — ABNORMAL HIGH (ref 4.6–6.5)

## 2023-04-19 MED ORDER — HYDROCHLOROTHIAZIDE 25 MG PO TABS: 25.0000 mg | ORAL_TABLET | Freq: Every day | ORAL | 0 refills | Status: DC

## 2023-04-19 MED ORDER — ROSUVASTATIN CALCIUM 10 MG PO TABS: 10.0000 mg | ORAL_TABLET | Freq: Every day | ORAL | 3 refills | Status: DC

## 2023-04-19 MED ORDER — METFORMIN HCL ER 500 MG PO TB24: 500.0000 mg | ORAL_TABLET | Freq: Every day | ORAL | 1 refills | Status: DC

## 2023-04-19 NOTE — Progress Notes (Signed)
Katie Simon is a 67 y.o. female with the following history as recorded in EpicCare:  Patient Active Problem List   Diagnosis Date Noted   Brief psychotic disorder (HCC) 07/16/2015   PTSD (post-traumatic stress disorder) 07/15/2015   Psychosis (HCC) 07/15/2015    Current Outpatient Medications  Medication Sig Dispense Refill   fluticasone (FLONASE) 50 MCG/ACT nasal spray Place 2 sprays into both nostrils daily. 16 g 6   hydrochlorothiazide (HYDRODIURIL) 25 MG tablet Take 1 tablet (25 mg total) by mouth daily. 90 tablet 0   rosuvastatin (CRESTOR) 10 MG tablet Take 1 tablet (10 mg total) by mouth daily. 90 tablet 3   metFORMIN (GLUCOPHAGE-XR) 500 MG 24 hr tablet Take 1 tablet (500 mg total) by mouth daily with breakfast. 90 tablet 1   No current facility-administered medications for this visit.    Allergies: Patient has no known allergies.  Past Medical History:  Diagnosis Date   Anemia    hx of   Anxiety    Arthritis    RIGHT knee, lower back   Blood transfusion without reported diagnosis 2003   with hysterecomty   Cataract    RIGHT eye- not a surgical candidate at this time (08/25/2021)   Depression    Diabetes mellitus without complication (HCC)    on meds   Hyperlipidemia    on meds   Hypertension    on meds   PTSD (post-traumatic stress disorder)    Seasonal allergies     Past Surgical History:  Procedure Laterality Date   ABDOMINAL HYSTERECTOMY  2003   COLONOSCOPY  2011   per pt- with Dr. Loreta Ave   TUBAL LIGATION  1990    Family History  Problem Relation Age of Onset   Colon cancer Mother 60   Colon polyps Mother 75   Diabetes Mother    Aneurysm Mother    Prostate cancer Father    Diabetes Father    Kidney disease Father    Stroke Father    Stomach cancer Sister 56   Ovarian cancer Sister    Diabetes Sister    Colon polyps Brother    Diabetes Brother    Kidney disease Brother    Colon polyps Brother 80   Diabetes Brother    Early death Brother     Bone cancer Brother    Heart disease Brother    Kidney cancer Brother    Esophageal cancer Neg Hx    Rectal cancer Neg Hx     Social History   Tobacco Use   Smoking status: Never   Smokeless tobacco: Never  Substance Use Topics   Alcohol use: No    Subjective:   Presents for yearly CPE; did not take her blood pressure medication this morning; has been taking her Crestor; is not taking her Metformin regularly;  Overdue to see her eye doctor and dentist;   Review of Systems  Constitutional: Negative.   HENT: Negative.    Eyes: Negative.   Respiratory: Negative.    Cardiovascular: Negative.   Gastrointestinal: Negative.   Genitourinary: Negative.   Musculoskeletal: Negative.   Skin: Negative.   Neurological: Negative.   Endo/Heme/Allergies: Negative.   Psychiatric/Behavioral: Negative.       Objective:  Vitals:   04/19/23 0950  BP: 138/86  Pulse: 75  Temp: 98.5 F (36.9 C)  TempSrc: Oral  SpO2: 97%  Weight: 188 lb 9.6 oz (85.5 kg)  Height: 4\' 11"  (1.499 m)    General: Well developed, well nourished,  in no acute distress  Skin : Warm and dry.  Head: Normocephalic and atraumatic  Eyes: Sclera and conjunctiva clear; pupils round and reactive to light; extraocular movements intact  Ears: External normal; canals clear; tympanic membranes normal  Oropharynx: Pink, supple. No suspicious lesions  Neck: Supple without thyromegaly, adenopathy  Lungs: Respirations unlabored; clear to auscultation bilaterally without wheeze, rales, rhonchi  CVS exam: normal rate and regular rhythm.  Abdomen: Soft; nontender; nondistended; normoactive bowel sounds; no masses or hepatosplenomegaly  Musculoskeletal: No deformities; no active joint inflammation  Extremities: No edema, cyanosis, clubbing  Vessels: Symmetric bilaterally  Neurologic: Alert and oriented; speech intact; face symmetrical; moves all extremities well; CNII-XII intact without focal deficit   Assessment:  1. PE  (physical exam), annual   2. Lipid screening   3. Type 2 diabetes mellitus without complication, with long-term current use of insulin (HCC)   4. Long term (current) use of oral hypoglycemic drugs   5. Visit for screening mammogram     Plan:  Age appropriate preventive healthcare needs addressed; encouraged regular eye doctor and dental exams; encouraged regular exercise; will update labs and refills as needed today; follow-up to be determined; Stressed need to take her medications daily as prescribed;  Order for screening mammogram updated; Follow up to be determined based on labs;    No follow-ups on file.  Orders Placed This Encounter  Procedures   MM Digital Screening    Standing Status:   Future    Standing Expiration Date:   04/18/2024    Order Specific Question:   Reason for Exam (SYMPTOM  OR DIAGNOSIS REQUIRED)    Answer:   screening mammogram    Order Specific Question:   Preferred imaging location?    Answer:   [facility]   CBC with Differential/Platelet   Comp Met (CMET)   Lipid panel   TSH   Hemoglobin A1c   Urine Microalbumin w/creat. ratio    Requested Prescriptions   Signed Prescriptions Disp Refills   rosuvastatin (CRESTOR) 10 MG tablet 90 tablet 3    Sig: Take 1 tablet (10 mg total) by mouth daily.   hydrochlorothiazide (HYDRODIURIL) 25 MG tablet 90 tablet 0    Sig: Take 1 tablet (25 mg total) by mouth daily.   metFORMIN (GLUCOPHAGE-XR) 500 MG 24 hr tablet 90 tablet 1    Sig: Take 1 tablet (500 mg total) by mouth daily with breakfast.

## 2023-04-19 NOTE — Addendum Note (Signed)
Addended by: Rosita Kea on: 04/19/2023 11:01 AM   Modules accepted: Orders

## 2023-04-21 ENCOUNTER — Ambulatory Visit (HOSPITAL_BASED_OUTPATIENT_CLINIC_OR_DEPARTMENT_OTHER)
Admission: RE | Admit: 2023-04-21 | Discharge: 2023-04-21 | Disposition: A | Discharge status: Home / Self Care | Payer: Medicare Other | Source: Ambulatory Visit | Attending: Family | Admitting: Family

## 2023-04-21 ENCOUNTER — Encounter (HOSPITAL_BASED_OUTPATIENT_CLINIC_OR_DEPARTMENT_OTHER): Payer: Self-pay

## 2023-04-21 DIAGNOSIS — Z1231 Encounter for screening mammogram for malignant neoplasm of breast: Secondary | ICD-10-CM | POA: Diagnosis not present

## 2023-07-11 ENCOUNTER — Other Ambulatory Visit: Payer: Self-pay

## 2023-07-11 ENCOUNTER — Encounter (HOSPITAL_COMMUNITY): Payer: Self-pay

## 2023-07-11 ENCOUNTER — Emergency Department (HOSPITAL_COMMUNITY)
Admission: EM | Admit: 2023-07-11 | Discharge: 2023-07-11 | Disposition: A | Discharge status: Home / Self Care | Payer: Medicare Other | Attending: Emergency Medicine | Admitting: Emergency Medicine

## 2023-07-11 DIAGNOSIS — R55 Syncope and collapse: Secondary | ICD-10-CM | POA: Insufficient documentation

## 2023-07-11 DIAGNOSIS — Z79899 Other long term (current) drug therapy: Secondary | ICD-10-CM | POA: Insufficient documentation

## 2023-07-11 DIAGNOSIS — I10 Essential (primary) hypertension: Secondary | ICD-10-CM | POA: Insufficient documentation

## 2023-07-11 DIAGNOSIS — E119 Type 2 diabetes mellitus without complications: Secondary | ICD-10-CM | POA: Diagnosis not present

## 2023-07-11 DIAGNOSIS — Z7984 Long term (current) use of oral hypoglycemic drugs: Secondary | ICD-10-CM | POA: Insufficient documentation

## 2023-07-11 DIAGNOSIS — R001 Bradycardia, unspecified: Secondary | ICD-10-CM | POA: Diagnosis not present

## 2023-07-11 LAB — BASIC METABOLIC PANEL
Anion gap: 10 (ref 5–15)
BUN: 9 mg/dL (ref 8–23)
CO2: 24 mmol/L (ref 22–32)
Calcium: 9.1 mg/dL (ref 8.9–10.3)
Chloride: 105 mmol/L (ref 98–111)
Creatinine, Ser: 0.9 mg/dL (ref 0.44–1.00)
GFR, Estimated: >60 mL/min (ref >60)
Glucose, Bld: 131 mg/dL — ABNORMAL HIGH (ref 70–99)
Potassium: 3.5 mmol/L (ref 3.5–5.1)
Sodium: 139 mmol/L (ref 135–145)

## 2023-07-11 LAB — CBC WITH DIFFERENTIAL/PLATELET
Abs Immature Granulocytes: 0.02 10*3/uL (ref 0.00–0.07)
Basophils Absolute: 0 10*3/uL (ref 0.0–0.1)
Basophils Relative: 1 %
Eosinophils Absolute: 0.2 10*3/uL (ref 0.0–0.5)
Eosinophils Relative: 3 %
HCT: 42.6 % (ref 36.0–46.0)
Hemoglobin: 13.5 g/dL (ref 12.0–15.0)
Immature Granulocytes: 0 %
Lymphocytes Relative: 33 %
Lymphs Abs: 1.8 10*3/uL (ref 0.7–4.0)
MCH: 26.4 pg (ref 26.0–34.0)
MCHC: 31.7 g/dL (ref 30.0–36.0)
MCV: 83.2 fL (ref 80.0–100.0)
Monocytes Absolute: 0.3 10*3/uL (ref 0.1–1.0)
Monocytes Relative: 6 %
Neutro Abs: 3.1 10*3/uL (ref 1.7–7.7)
Neutrophils Relative %: 57 %
Platelets: 295 10*3/uL (ref 150–400)
RBC: 5.12 MIL/uL — ABNORMAL HIGH (ref 3.87–5.11)
RDW: 14.1 % (ref 11.5–15.5)
WBC: 5.4 10*3/uL (ref 4.0–10.5)
nRBC: 0 % (ref 0.0–0.2)

## 2023-07-11 NOTE — ED Provider Notes (Signed)
[place] EMERGENCY DEPARTMENT AT [facility] Provider Note   CSN: [id number] Arrival date & time: 07/11/23  1232     History  Chief Complaint  Patient presents with   Loss of Consciousness   Fall   Back Pain    Katie Simon is a 67 y.o. female.  Patient is a 67 year old female with a history of diabetes, hypertension, anemia and recurrent syncopal events who is presenting today after an episode of syncope.  Patient reports for the last 5 years she has intermittent bouts of losing consciousness usually associated with very stressful situations.  Today she was already feeling stressed because she was unable to go to a friend's memorial service yesterday and then she had family members who are having an argument and she had a syncopal event.  She reports lowering herself to the carpet and not having any injuries.  She is unclear how long she was out for but reports now she feels back to her baseline except for feeling tired which she reports is very typical after having 1 of these events.  Her daughter is present at bedside and states that this is very typical of her prior episodes.  She has not recently been ill or had any medication changes.  She denies any infectious symptoms today.  She denies any palpitations, chest pain or shortness of breath.  The history is provided by the patient and medical records.  Loss of Consciousness Episode history:  Single Most recent episode:  Today Fall  Back Pain      Home Medications Prior to Admission medications   Medication Sig Start Date End Date Taking? Authorizing Provider  fluticasone (FLONASE) 50 MCG/ACT nasal spray Place 2 sprays into both nostrils daily. 04/13/22   Olive Bass, FNP  hydrochlorothiazide (HYDRODIURIL) 25 MG tablet Take 1 tablet (25 mg total) by mouth daily. 04/19/23   Olive Bass, FNP  metFORMIN (GLUCOPHAGE-XR) 500 MG 24 hr tablet Take 1 tablet (500 mg total) by mouth daily with  breakfast. 04/19/23   Olive Bass, FNP  rosuvastatin (CRESTOR) 10 MG tablet Take 1 tablet (10 mg total) by mouth daily. 04/19/23   Olive Bass, FNP      Allergies    Patient has no known allergies.    Review of Systems   Review of Systems  Cardiovascular:  Positive for syncope.  Musculoskeletal:  Positive for back pain.    Physical Exam Updated Vital Signs BP (!) 159/76   Pulse 65   Temp 98.5 F (36.9 C)   Resp (!) 23   SpO2 100%  Physical Exam Vitals and nursing note reviewed.  Constitutional:      General: She is not in acute distress.    Appearance: She is well-developed.  HENT:     Head: Normocephalic and atraumatic.  Eyes:     Pupils: Pupils are equal, round, and reactive to light.  Cardiovascular:     Rate and Rhythm: Normal rate and regular rhythm.     Pulses: Normal pulses.     Heart sounds: Normal heart sounds. No murmur heard.    No friction rub.  Pulmonary:     Effort: Pulmonary effort is normal.     Breath sounds: Normal breath sounds. No wheezing or rales.  Abdominal:     General: Bowel sounds are normal. There is no distension.     Palpations: Abdomen is soft.     Tenderness: There is no abdominal tenderness. There is no guarding or  rebound.  Musculoskeletal:        General: No tenderness. Normal range of motion.     Right lower leg: No edema.     Left lower leg: No edema.     Comments: No edema  Skin:    General: Skin is warm and dry.     Findings: No rash.  Neurological:     Mental Status: She is alert and oriented to person, place, and time. Mental status is at baseline.     Sensory: No sensory deficit.     Motor: No weakness.     Gait: Gait normal.  Psychiatric:        Mood and Affect: Mood normal.        Behavior: Behavior normal.     ED Results / Procedures / Treatments   Labs (all labs ordered are listed, but only abnormal results are displayed) Labs Reviewed  CBC WITH DIFFERENTIAL/PLATELET - Abnormal; Notable  for the following components:      Result Value   RBC 5.12 (*)    All other components within normal limits  BASIC METABOLIC PANEL - Abnormal; Notable for the following components:   Glucose, Bld 131 (*)    All other components within normal limits    EKG EKG Interpretation Date/Time:  Monday July 11 2023 12:40:34 EDT Ventricular Rate:  73 PR Interval:  182 QRS Duration:  90 QT Interval:  376 QTC Calculation: 415 R Axis:   29  Text Interpretation: Sinus rhythm new Low voltage, precordial leads Abnormal R-wave progression, early transition Borderline T abnormalities, anterior leads Confirmed by Gwyneth Sprout ([id number]) on 07/11/2023 12:50:58 PM  Radiology No results found.  Procedures Procedures    Medications Ordered in ED Medications - No data to display  ED Course/ Medical Decision Making/ A&P                                 Medical Decision Making Amount and/or Complexity of Data Reviewed Labs: ordered. Decision-making details documented in ED Course. ECG/medicine tests: ordered and independent interpretation performed. Decision-making details documented in ED Course.   Pt with multiple medical problems and comorbidities and presenting today with a complaint that caries a high risk for morbidity and mortality.  Here today for a syncopal event.  She reports this event was brought on by stress and concern for possible vasovagal event.  Patient has not been hypotensive has no reason to be dehydrated and is mildly hypertensive here but the rest of her vital signs are normal.  She denies any chest pain, shortness of breath or palpitations prior to the event.  This is similar to past events she has had over the last 5 years.  I independently interpreted patient's labs and EKG.  EKG shows no evidence of dysrhythmia today.  No new ST changes, prolonged QT or evidence of Brugada's.  CBC and BMP within normal limits with no evidence of anemia or electrolyte abnormalities today.   Given patient's history, prior episodes and reassuring workup feel that she is stable for discharge home but did recommend following up with PCP if she continues to have events.  No social determinants affecting patient's discharge today.  Patient and her family are comfortable with this plan.         Final Clinical Impression(s) / ED Diagnoses Final diagnoses:  Syncope, unspecified syncope type    Rx / DC Orders ED Discharge Orders  None         Gwyneth Sprout, MD 07/11/23 1434

## 2023-07-11 NOTE — ED Triage Notes (Signed)
EMS reports from home, syncopal episode with fall from standing. C/o back pain. Hx of chronic back pain. Hypertension.  BP 180/108 HR 94 RR 18 Sp02 99 RA CBG 174

## 2023-07-11 NOTE — Discharge Instructions (Addendum)
Try to stay home and avoid stress.  Get plenty of rest to make sure you are staying hydrated.  Your blood work today was normal with no signs of anemia or issues with your kidneys.

## 2023-08-09 ENCOUNTER — Telehealth: Payer: Self-pay

## 2023-08-09 NOTE — Telephone Encounter (Signed)
Transition Care Management Follow-up Telephone Call Date of discharge and from where: 07/11/2023 [facility] How have you been since you were released from the hospital? Patient stated she is feeling better. Any questions or concerns? No  Items Reviewed: Did the pt receive and understand the discharge instructions provided? Yes  Medications obtained and verified?  No medication prescribed. Other? No  Any new allergies since your discharge? No  Dietary orders reviewed? Yes Do you have support at home? Yes   Follow up appointments reviewed:  PCP Hospital f/u appt confirmed?  Patient stated she will make an appointment if she continues to have episodes of syncope.  Scheduled to see  on  @ . Specialist Hospital f/u appt confirmed? No  Scheduled to see  on  @ . Are transportation arrangements needed? No  If their condition worsens, is the pt aware to call PCP or go to the Emergency Dept.? Yes Was the patient provided with contact information for the PCP's office or ED? Yes Was to pt encouraged to call back with questions or concerns? Yes   Kajol Crispen [facility]  [facility] Guide Direct Dial: [phone]  Website: [email or web address]

## 2023-08-12 DIAGNOSIS — S0101XA Laceration without foreign body of scalp, initial encounter: Secondary | ICD-10-CM | POA: Diagnosis not present

## 2023-08-31 ENCOUNTER — Other Ambulatory Visit: Payer: Self-pay | Admitting: Family

## 2023-10-13 ENCOUNTER — Encounter: Payer: Self-pay | Admitting: Family

## 2023-11-29 ENCOUNTER — Other Ambulatory Visit: Payer: Self-pay | Admitting: Family

## 2023-12-01 ENCOUNTER — Other Ambulatory Visit: Payer: Self-pay | Admitting: Family

## 2023-12-01 MED ORDER — HYDROCHLOROTHIAZIDE 25 MG PO TABS: 25.0000 mg | ORAL_TABLET | Freq: Every day | ORAL | 0 refills | Status: DC

## 2024-03-13 ENCOUNTER — Other Ambulatory Visit: Payer: Self-pay | Admitting: Family

## 2024-03-13 ENCOUNTER — Ambulatory Visit: Payer: Self-pay | Admitting: Family

## 2024-03-13 ENCOUNTER — Encounter: Payer: Self-pay | Admitting: Family

## 2024-03-13 ENCOUNTER — Other Ambulatory Visit (HOSPITAL_BASED_OUTPATIENT_CLINIC_OR_DEPARTMENT_OTHER): Payer: Self-pay | Admitting: Family

## 2024-03-13 ENCOUNTER — Ambulatory Visit (INDEPENDENT_AMBULATORY_CARE_PROVIDER_SITE_OTHER): Admitting: Family

## 2024-03-13 VITALS — BP 150/92 | HR 75 | Ht 59.0 in | Wt 190.6 lb

## 2024-03-13 DIAGNOSIS — I10 Essential (primary) hypertension: Secondary | ICD-10-CM

## 2024-03-13 DIAGNOSIS — E119 Type 2 diabetes mellitus without complications: Secondary | ICD-10-CM

## 2024-03-13 DIAGNOSIS — Z1231 Encounter for screening mammogram for malignant neoplasm of breast: Secondary | ICD-10-CM

## 2024-03-13 DIAGNOSIS — R1900 Intra-abdominal and pelvic swelling, mass and lump, unspecified site: Secondary | ICD-10-CM | POA: Diagnosis not present

## 2024-03-13 LAB — COMPREHENSIVE METABOLIC PANEL WITH GFR
ALT: 11 U/L (ref 0–35)
AST: 17 U/L (ref 0–37)
Albumin: 4.1 g/dL (ref 3.5–5.2)
Alkaline Phosphatase: 87 U/L (ref 39–117)
BUN: 8 mg/dL (ref 6–23)
CO2: 28 meq/L (ref 19–32)
Calcium: 9.3 mg/dL (ref 8.4–10.5)
Chloride: 103 meq/L (ref 96–112)
Creatinine, Ser: 0.8 mg/dL (ref 0.40–1.20)
GFR: 75.47 mL/min (ref >60.00)
Glucose, Bld: 142 mg/dL — ABNORMAL HIGH (ref 70–99)
Potassium: 4.6 meq/L (ref 3.5–5.1)
Sodium: 139 meq/L (ref 135–145)
Total Bilirubin: 0.5 mg/dL (ref 0.2–1.2)
Total Protein: 7.3 g/dL (ref 6.0–8.3)

## 2024-03-13 LAB — CBC WITH DIFFERENTIAL/PLATELET
Basophils Absolute: 0 10*3/uL (ref 0.0–0.1)
Basophils Relative: 0.3 % (ref 0.0–3.0)
Eosinophils Absolute: 0.2 10*3/uL (ref 0.0–0.7)
Eosinophils Relative: 3.7 % (ref 0.0–5.0)
HCT: 40.7 % (ref 36.0–46.0)
Hemoglobin: 13 g/dL (ref 12.0–15.0)
Lymphocytes Relative: 39 % (ref 12.0–46.0)
Lymphs Abs: 2.3 10*3/uL (ref 0.7–4.0)
MCHC: 32 g/dL (ref 30.0–36.0)
MCV: 83.6 fl (ref 78.0–100.0)
Monocytes Absolute: 0.3 10*3/uL (ref 0.1–1.0)
Monocytes Relative: 5.5 % (ref 3.0–12.0)
Neutro Abs: 3.1 10*3/uL (ref 1.4–7.7)
Neutrophils Relative %: 51.5 % (ref 43.0–77.0)
Platelets: 284 10*3/uL (ref 150.0–400.0)
RBC: 4.87 Mil/uL (ref 3.87–5.11)
RDW: 15 % (ref 11.5–15.5)
WBC: 6 10*3/uL (ref 4.0–10.5)

## 2024-03-13 LAB — HEMOGLOBIN A1C: Hgb A1c MFr Bld: 8.2 % — ABNORMAL HIGH (ref 4.6–6.5)

## 2024-03-13 MED ORDER — METFORMIN HCL ER 500 MG PO TB24: 500.0000 mg | ORAL_TABLET | Freq: Two times a day (BID) | ORAL | 0 refills | Status: DC

## 2024-03-13 MED ORDER — FLUTICASONE PROPIONATE 50 MCG/ACT NA SUSP: 2.0000 | Freq: Every day | NASAL | 6 refills | Status: AC

## 2024-03-13 MED ORDER — HYDROCHLOROTHIAZIDE 25 MG PO TABS: 25.0000 mg | ORAL_TABLET | Freq: Every day | ORAL | 3 refills | Status: AC

## 2024-03-13 NOTE — Progress Notes (Signed)
 Karsten Gerlich is a 68 y.o. female with the following history as recorded in EpicCare:  Patient Active Problem List   Diagnosis Date Noted   Brief psychotic disorder (HCC) 07/16/2015   PTSD (post-traumatic stress disorder) 07/15/2015   Psychosis (HCC) 07/15/2015    Current Outpatient Medications  Medication Sig Dispense Refill   metFORMIN  (GLUCOPHAGE -XR) 500 MG 24 hr tablet Take 1 tablet by mouth once daily with breakfast 30 tablet 0   rosuvastatin  (CRESTOR ) 10 MG tablet Take 1 tablet (10 mg total) by mouth daily. 90 tablet 3   fluticasone  (FLONASE ) 50 MCG/ACT nasal spray Place 2 sprays into both nostrils daily. 16 g 6   hydrochlorothiazide  (HYDRODIURIL ) 25 MG tablet Take 1 tablet (25 mg total) by mouth daily. 90 tablet 3   No current facility-administered medications for this visit.    Allergies: Patient has no known allergies.  Past Medical History:  Diagnosis Date   Anemia    hx of   Anxiety    Arthritis    RIGHT knee, lower back   Blood transfusion without reported diagnosis 2003   with hysterecomty   Cataract    RIGHT eye- not a surgical candidate at this time (08/25/2021)   Depression    Diabetes mellitus without complication (HCC)    on meds   Hyperlipidemia    on meds   Hypertension    on meds   PTSD (post-traumatic stress disorder)    Seasonal allergies     Past Surgical History:  Procedure Laterality Date   ABDOMINAL HYSTERECTOMY  2003   COLONOSCOPY  2011   per pt- with Dr. Tova Fresh   TUBAL LIGATION  1990    Family History  Problem Relation Age of Onset   Colon cancer Mother 47   Colon polyps Mother 42   Diabetes Mother    Aneurysm Mother    Prostate cancer Father    Diabetes Father    Kidney disease Father    Stroke Father    Stomach cancer Sister 36   Ovarian cancer Sister    Diabetes Sister    Colon polyps Brother    Diabetes Brother    Kidney disease Brother    Colon polyps Brother 77   Diabetes Brother    Early death Brother    Bone  cancer Brother    Heart disease Brother    Kidney cancer Brother    Esophageal cancer Neg Hx    Rectal cancer Neg Hx     Social History   Tobacco Use   Smoking status: Never   Smokeless tobacco: Never  Substance Use Topics   Alcohol use: No    Subjective:   Concerned about "lump" that she noticed in her left side of her abdomen- just noticed while she was in the shower; no abdominal pain, no changes in bowel movements;   Also requesting refill on hydrochlorothiazide - has been out of medication;   Per patient, she is taking her Metformin  and Rosuvastatin  regularly; overdue to check her diabetes- agreeable to labs today;   Objective:  Vitals:   03/13/24 1042 03/13/24 1149  BP: (!) 150/92 (!) 150/92  Pulse: 75   SpO2: 100%   Weight: 190 lb 9.6 oz (86.5 kg)   Height: 4\' 11"  (1.499 m)     General: Well developed, well nourished, in no acute distress  Skin : Warm and dry.  Head: Normocephalic and atraumatic  Lungs: Respirations unlabored; clear to auscultation bilaterally without wheeze, rales, rhonchi  Abdomen: Soft; nontender;  nondistended; normoactive bowel sounds; soft tissue mass noted left middle abdomen Musculoskeletal: No deformities; no active joint inflammation  Neurologic: Alert and oriented; speech intact; face symmetrical; moves all extremities well; CNII-XII intact without focal deficit   Assessment:  1. Type 2 diabetes mellitus without complication, without long-term current use of insulin (HCC)   2. Primary hypertension   3. Mass of soft tissue of abdomen     Plan:  ? Control- labs are overdue; check CBC, CMP, hgba1c today; Needs to take medication daily as prescribed- refill updated; Suspect lipoma- update CT; follow up to be determined;   Return in about 2 months (around 05/13/2024) for CPE.  Orders Placed This Encounter  Procedures   CT ABDOMEN PELVIS W CONTRAST    Standing Status:   Future    Expiration Date:   03/13/2025    If indicated for the  ordered procedure, I authorize the administration of contrast media per Radiology protocol:   Yes    Does the patient have a contrast media/X-ray dye allergy?:   No    Preferred imaging location?:   [facility]    If indicated for the ordered procedure, I authorize the administration of oral contrast media per Radiology protocol:   Yes   CBC with Differential/Platelet   Comp Met (CMET)   Hemoglobin A1c    Requested Prescriptions   Signed Prescriptions Disp Refills   hydrochlorothiazide  (HYDRODIURIL ) 25 MG tablet 90 tablet 3    Sig: Take 1 tablet (25 mg total) by mouth daily.   fluticasone  (FLONASE ) 50 MCG/ACT nasal spray 16 g 6    Sig: Place 2 sprays into both nostrils daily.

## 2024-04-05 ENCOUNTER — Ambulatory Visit: Admitting: *Deleted

## 2024-04-17 NOTE — Progress Notes (Signed)
 Pt did not answer x 2. Visit not completed.

## 2024-04-23 ENCOUNTER — Ambulatory Visit (HOSPITAL_BASED_OUTPATIENT_CLINIC_OR_DEPARTMENT_OTHER)
Admission: RE | Admit: 2024-04-23 | Discharge: 2024-04-23 | Disposition: A | Discharge status: Home / Self Care | Source: Ambulatory Visit | Attending: Family | Admitting: Family

## 2024-04-23 ENCOUNTER — Encounter (HOSPITAL_BASED_OUTPATIENT_CLINIC_OR_DEPARTMENT_OTHER): Payer: Self-pay

## 2024-04-23 DIAGNOSIS — R1900 Intra-abdominal and pelvic swelling, mass and lump, unspecified site: Secondary | ICD-10-CM | POA: Insufficient documentation

## 2024-04-23 DIAGNOSIS — N281 Cyst of kidney, acquired: Secondary | ICD-10-CM | POA: Diagnosis not present

## 2024-04-23 DIAGNOSIS — Z1231 Encounter for screening mammogram for malignant neoplasm of breast: Secondary | ICD-10-CM | POA: Diagnosis not present

## 2024-04-23 DIAGNOSIS — K439 Ventral hernia without obstruction or gangrene: Secondary | ICD-10-CM | POA: Diagnosis not present

## 2024-04-23 MED ORDER — IOHEXOL 300 MG/ML  SOLN
100.0000 mL | Freq: Once | INTRAMUSCULAR | Status: AC | PRN
  Administered 2024-04-23: 100 mL via INTRAVENOUS

## 2024-05-01 ENCOUNTER — Other Ambulatory Visit: Payer: Self-pay | Admitting: Family

## 2024-05-01 DIAGNOSIS — K439 Ventral hernia without obstruction or gangrene: Secondary | ICD-10-CM

## 2024-05-07 DIAGNOSIS — K429 Umbilical hernia without obstruction or gangrene: Secondary | ICD-10-CM | POA: Diagnosis not present

## 2024-05-15 ENCOUNTER — Ambulatory Visit: Payer: Self-pay | Admitting: Family

## 2024-05-15 ENCOUNTER — Ambulatory Visit (INDEPENDENT_AMBULATORY_CARE_PROVIDER_SITE_OTHER): Admitting: Family

## 2024-05-15 ENCOUNTER — Encounter: Payer: Self-pay | Admitting: Family

## 2024-05-15 ENCOUNTER — Other Ambulatory Visit: Payer: Self-pay | Admitting: Family

## 2024-05-15 VITALS — BP 134/82 | HR 71 | Ht 59.0 in | Wt 188.8 lb

## 2024-05-15 DIAGNOSIS — E119 Type 2 diabetes mellitus without complications: Secondary | ICD-10-CM | POA: Diagnosis not present

## 2024-05-15 DIAGNOSIS — E785 Hyperlipidemia, unspecified: Secondary | ICD-10-CM

## 2024-05-15 DIAGNOSIS — L304 Erythema intertrigo: Secondary | ICD-10-CM | POA: Diagnosis not present

## 2024-05-15 LAB — COMPREHENSIVE METABOLIC PANEL WITH GFR
ALT: 10 U/L (ref 0–35)
AST: 16 U/L (ref 0–37)
Albumin: 4.4 g/dL (ref 3.5–5.2)
Alkaline Phosphatase: 83 U/L (ref 39–117)
BUN: 13 mg/dL (ref 6–23)
CO2: 33 meq/L — ABNORMAL HIGH (ref 19–32)
Calcium: 9.9 mg/dL (ref 8.4–10.5)
Chloride: 101 meq/L (ref 96–112)
Creatinine, Ser: 0.89 mg/dL (ref 0.40–1.20)
GFR: 66.32 mL/min (ref >60.00)
Glucose, Bld: 165 mg/dL — ABNORMAL HIGH (ref 70–99)
Potassium: 4.6 meq/L (ref 3.5–5.1)
Sodium: 139 meq/L (ref 135–145)
Total Bilirubin: 0.6 mg/dL (ref 0.2–1.2)
Total Protein: 7.5 g/dL (ref 6.0–8.3)

## 2024-05-15 LAB — CBC WITH DIFFERENTIAL/PLATELET
Basophils Absolute: 0 K/uL (ref 0.0–0.1)
Basophils Relative: 0.9 % (ref 0.0–3.0)
Eosinophils Absolute: 0.2 K/uL (ref 0.0–0.7)
Eosinophils Relative: 4.6 % (ref 0.0–5.0)
HCT: 41.1 % (ref 36.0–46.0)
Hemoglobin: 13.4 g/dL (ref 12.0–15.0)
Lymphocytes Relative: 38.7 % (ref 12.0–46.0)
Lymphs Abs: 1.9 K/uL (ref 0.7–4.0)
MCHC: 32.7 g/dL (ref 30.0–36.0)
MCV: 82.1 fl (ref 78.0–100.0)
Monocytes Absolute: 0.4 K/uL (ref 0.1–1.0)
Monocytes Relative: 8 % (ref 3.0–12.0)
Neutro Abs: 2.4 K/uL (ref 1.4–7.7)
Neutrophils Relative %: 47.8 % (ref 43.0–77.0)
Platelets: 292 K/uL (ref 150.0–400.0)
RBC: 5 Mil/uL (ref 3.87–5.11)
RDW: 14.7 % (ref 11.5–15.5)
WBC: 5 K/uL (ref 4.0–10.5)

## 2024-05-15 LAB — HEMOGLOBIN A1C: Hgb A1c MFr Bld: 8 % — ABNORMAL HIGH (ref 4.6–6.5)

## 2024-05-15 LAB — LIPID PANEL
Cholesterol: 218 mg/dL — ABNORMAL HIGH (ref 0–200)
HDL: 54.6 mg/dL (ref >39.00)
LDL Cholesterol: 143 mg/dL — ABNORMAL HIGH (ref 0–99)
NonHDL: 163.54
Total CHOL/HDL Ratio: 4
Triglycerides: 103 mg/dL (ref 0.0–149.0)
VLDL: 20.6 mg/dL (ref 0.0–40.0)

## 2024-05-15 MED ORDER — NYSTATIN-TRIAMCINOLONE 100000-0.1 UNIT/GM-% EX CREA: 1.0000 | TOPICAL_CREAM | Freq: Two times a day (BID) | CUTANEOUS | 0 refills | Status: DC

## 2024-05-15 MED ORDER — ROSUVASTATIN CALCIUM 10 MG PO TABS: 10.0000 mg | ORAL_TABLET | Freq: Every day | ORAL | 3 refills | Status: AC

## 2024-05-15 MED ORDER — METFORMIN HCL ER 500 MG PO TB24: ORAL_TABLET | ORAL | 0 refills | Status: DC

## 2024-05-15 NOTE — Progress Notes (Signed)
 Katie Simon is a 68 y.o. female with the following history as recorded in EpicCare:  Patient Active Problem List   Diagnosis Date Noted   Brief psychotic disorder (HCC) 07/16/2015   PTSD (post-traumatic stress disorder) 07/15/2015   Psychosis (HCC) 07/15/2015    Current Outpatient Medications  Medication Sig Dispense Refill   fluticasone  (FLONASE ) 50 MCG/ACT nasal spray Place 2 sprays into both nostrils daily. 16 g 6   hydrochlorothiazide  (HYDRODIURIL ) 25 MG tablet Take 1 tablet (25 mg total) by mouth daily. 90 tablet 3   metFORMIN  (GLUCOPHAGE -XR) 500 MG 24 hr tablet Take 1 tablet (500 mg total) by mouth 2 (two) times daily with a meal. 180 tablet 0   nystatin -triamcinolone  (MYCOLOG II) cream Apply 1 Application topically 2 (two) times daily. 30 g 0   rosuvastatin  (CRESTOR ) 10 MG tablet Take 1 tablet (10 mg total) by mouth daily. 90 tablet 3   No current facility-administered medications for this visit.    Allergies: Patient has no known allergies.  Past Medical History:  Diagnosis Date   Anemia    hx of   Anxiety    Arthritis    RIGHT knee, lower back   Blood transfusion without reported diagnosis 2003   with hysterecomty   Cataract    RIGHT eye- not a surgical candidate at this time (08/25/2021)   Depression    Diabetes mellitus without complication (HCC)    on meds   Hyperlipidemia    on meds   Hypertension    on meds   PTSD (post-traumatic stress disorder)    Seasonal allergies     Past Surgical History:  Procedure Laterality Date   ABDOMINAL HYSTERECTOMY  2003   COLONOSCOPY  2011   per pt- with Dr. Kristie   TUBAL LIGATION  1990    Family History  Problem Relation Age of Onset   Colon cancer Mother 33   Colon polyps Mother 61   Diabetes Mother    Aneurysm Mother    Prostate cancer Father    Diabetes Father    Kidney disease Father    Stroke Father    Stomach cancer Sister 27   Ovarian cancer Sister    Diabetes Sister    Colon polyps Brother     Diabetes Brother    Kidney disease Brother    Colon polyps Brother 27   Diabetes Brother    Early death Brother    Bone cancer Brother    Heart disease Brother    Kidney cancer Brother    Esophageal cancer Neg Hx    Rectal cancer Neg Hx     Social History   Tobacco Use   Smoking status: Never   Smokeless tobacco: Never  Substance Use Topics   Alcohol use: No    Subjective:   2 month follow up on Type 2 Diabetes- at last OV, her Metformin  was increased and stressed need to take medication regularly as prescribed; Denies any chest pain, shortness of breath, blurred vision or headache; trying to take her Metformin  daily as prescription;    Objective:  Vitals:   05/15/24 0821  BP: 134/82  Pulse: 71  SpO2: 96%  Weight: 188 lb 12.8 oz (85.6 kg)  Height: 4' 11 (1.499 m)    General: Well developed, well nourished, in no acute distress  Skin : Warm and dry.  Head: Normocephalic and atraumatic  Lungs: Respirations unlabored; clear to auscultation bilaterally without wheeze, rales, rhonchi  CVS exam: normal rate and regular rhythm.  Neurologic: Alert and oriented; speech intact; face symmetrical; moves all extremities well; CNII-XII intact without focal deficit   Assessment:  1. Type 2 diabetes mellitus without complication, without long-term current use of insulin (HCC)   2. Hyperlipidemia, unspecified hyperlipidemia type   3. Intertrigo     Plan:  Check CBC, CMP, Hgba1c; encouraged to take medication daily as directed;  Reassurance- encouraged to keep skin dry; Rx for Mycolog II apply bid to affected area;   Follow up in 4 months, sooner prn.   No follow-ups on file.  Orders Placed This Encounter  Procedures   CBC with Differential/Platelet   Comp Met (CMET)   Hemoglobin A1c   Lipid panel    Requested Prescriptions   Signed Prescriptions Disp Refills   rosuvastatin  (CRESTOR ) 10 MG tablet 90 tablet 3    Sig: Take 1 tablet (10 mg total) by mouth daily.    nystatin -triamcinolone  (MYCOLOG II) cream 30 g 0    Sig: Apply 1 Application topically 2 (two) times daily.

## 2024-05-16 ENCOUNTER — Other Ambulatory Visit: Payer: Self-pay | Admitting: Family

## 2024-05-16 DIAGNOSIS — M549 Dorsalgia, unspecified: Secondary | ICD-10-CM

## 2024-05-18 ENCOUNTER — Other Ambulatory Visit (HOSPITAL_COMMUNITY): Payer: Self-pay

## 2024-05-18 ENCOUNTER — Telehealth: Payer: Self-pay

## 2024-05-18 ENCOUNTER — Other Ambulatory Visit: Payer: Self-pay | Admitting: Family

## 2024-05-18 MED ORDER — CLOTRIMAZOLE-BETAMETHASONE 1-0.05 % EX CREA: 1.0000 | TOPICAL_CREAM | Freq: Two times a day (BID) | CUTANEOUS | 0 refills | Status: AC

## 2024-05-18 NOTE — Telephone Encounter (Signed)
 Pharmacy Patient Advocate Encounter   Received notification from CoverMyMeds that prior authorization for Nystatin -Triamcinolone  100000-0.1UNIT/GM-% cream  is due for renewal.   Insurance verification completed.   The patient is insured through [facility].  Action: Non-Form, Dr. Vada ePA at [email or web address] CLOTRIMAZOLE;KETOCONAZOLE CREA;CLOTRIMAZOLE/BETAMETHASONE;ECONAZOLE  REF'Dis preferred by the insurance.  If suggested medication is appropriate, Please send in a new RX and discontinue this one. If not, please advise as to why it's not appropriate so that we may request a Prior Authorization. Please note, some preferred medications may still require a PA.  If the suggested medications have not been trialed and there are no contraindications to their use, the PA will not be submitted, as it will not be approved. PLEASE SEE TEST CLAIM FOR PROFFERED MEDS        PLEASE BE ADVISED IF NOT  If suggested medication IS NOT appropriate WE WILL PROCEED WITH PA

## 2024-05-21 NOTE — Telephone Encounter (Signed)
 My chart message sent to pt.

## 2024-05-22 ENCOUNTER — Other Ambulatory Visit (HOSPITAL_COMMUNITY): Payer: Self-pay

## 2024-05-29 ENCOUNTER — Telehealth: Payer: Self-pay

## 2024-05-29 NOTE — Telephone Encounter (Signed)
 Patient was identified as falling into the [place] Measure - Diabetes.   Patient was: Appointment already scheduled for:  Pt has f/u scheduled with PCP.

## 2024-07-10 ENCOUNTER — Ambulatory Visit (INDEPENDENT_AMBULATORY_CARE_PROVIDER_SITE_OTHER)

## 2024-07-10 VITALS — Ht 59.0 in | Wt 188.0 lb

## 2024-07-10 DIAGNOSIS — Z0000 Encounter for general adult medical examination without abnormal findings: Secondary | ICD-10-CM

## 2024-07-10 NOTE — Progress Notes (Signed)
 Subjective:   Katie Simon is a 68 y.o. who presents for a Medicare Wellness preventive visit.  As a reminder, Annual Wellness Visits don't include a physical exam, and some assessments may be limited, especially if this visit is performed virtually. We may recommend an in-person follow-up visit with your provider if needed.  Visit Complete: Virtual I connected with  Katie Simon on 07/10/24 by a audio enabled telemedicine application and verified that I am speaking with the correct person using two identifiers.  Patient Location: Home  Provider Location: Home Office  I discussed the limitations of evaluation and management by telemedicine. The patient expressed understanding and agreed to proceed.  Vital Signs: Because this visit was a virtual/telehealth visit, some criteria may be missing or patient reported. Any vitals not documented were not able to be obtained and vitals that have been documented are patient reported.  VideoDeclined- This patient declined Librarian, academic. Therefore the visit was completed with audio only.  Persons Participating in Visit: Patient.  AWV Questionnaire: Yes: Patient Medicare AWV questionnaire was completed by the patient on 07/03/24; I have confirmed that all information answered by patient is correct and no changes since this date.  Cardiac Risk Factors include: advanced age (>21men, >9 women);diabetes mellitus;dyslipidemia;hypertension     Objective:    Today's Vitals   07/10/24 1114  Weight: 188 lb (85.3 kg)  Height: 4' 11 (1.499 m)   Body mass index is 37.97 kg/m.     07/10/2024   11:21 AM 07/11/2023   12:41 PM 02/11/2023   12:37 PM 02/09/2022    1:42 PM 08/24/2017    5:09 PM 08/24/2017   12:30 PM 08/11/2016   12:53 PM  Advanced Directives  Does Patient Have a Medical Advance Directive? No No No No No  No  No   Would patient like information on creating a medical advance directive? Yes  (MAU/Ambulatory/Procedural Areas - Information given) No - Patient declined No - Patient declined No - Patient declined   No - patient declined information      Data saved with a previous flowsheet row definition    Current Medications (verified) Outpatient Encounter Medications as of 07/10/2024  Medication Sig   hydrochlorothiazide  (HYDRODIURIL ) 25 MG tablet Take 1 tablet (25 mg total) by mouth daily.   metFORMIN  (GLUCOPHAGE -XR) 500 MG 24 hr tablet Take 1 tablet in the am and take 2 tablets in the pm   rosuvastatin  (CRESTOR ) 10 MG tablet Take 1 tablet (10 mg total) by mouth daily.   clotrimazole -betamethasone  (LOTRISONE ) cream Apply 1 Application topically 2 (two) times daily.   fluticasone  (FLONASE ) 50 MCG/ACT nasal spray Place 2 sprays into both nostrils daily.   No facility-administered encounter medications on file as of 07/10/2024.    Allergies (verified) Patient has no known allergies.   History: Past Medical History:  Diagnosis Date   Anemia    hx of   Anxiety    Arthritis    RIGHT knee, lower back   Blood transfusion without reported diagnosis 2003   with hysterecomty   Cataract    RIGHT eye- not a surgical candidate at this time (08/25/2021)   Depression    Diabetes mellitus without complication (HCC)    on meds   Hyperlipidemia    on meds   Hypertension    on meds   PTSD (post-traumatic stress disorder)    Seasonal allergies    Past Surgical History:  Procedure Laterality Date   ABDOMINAL HYSTERECTOMY  2003  COLONOSCOPY  2011   per pt- with Dr. Kristie   TUBAL LIGATION  1990   Family History  Problem Relation Age of Onset   Colon cancer Mother 30   Colon polyps Mother 88   Diabetes Mother    Aneurysm Mother    Arthritis Mother    Prostate cancer Father    Diabetes Father    Kidney disease Father    Stroke Father    Stomach cancer Sister 72   Ovarian cancer Sister    Diabetes Sister    Colon polyps Brother    Diabetes Brother    Kidney disease  Brother    Colon polyps Brother 60   Diabetes Brother    Early death Brother    Bone cancer Brother    Heart disease Brother    Kidney cancer Brother    Esophageal cancer Neg Hx    Rectal cancer Neg Hx    Social History   Socioeconomic History   Marital status: Single    Spouse name: Not on file   Number of children: Not on file   Years of education: Not on file   Highest education level: 12th grade  Occupational History   Not on file  Tobacco Use   Smoking status: Never   Smokeless tobacco: Never  Vaping Use   Vaping status: Never Used  Substance and Sexual Activity   Alcohol use: No   Drug use: No   Sexual activity: Not Currently  Other Topics Concern   Not on file  Social History Narrative   Not on file   Social Drivers of Health   Financial Resource Strain: Patient Declined (07/10/2024)   Overall Financial Resource Strain (CARDIA)    Difficulty of Paying Living Expenses: Patient declined  Food Insecurity: No Food Insecurity (07/10/2024)   Hunger Vital Sign    Worried About Running Out of Food in the Last Year: Never true    Ran Out of Food in the Last Year: Never true  Transportation Needs: No Transportation Needs (07/10/2024)   PRAPARE - Administrator, Civil Service (Medical): No    Lack of Transportation (Non-Medical): No  Physical Activity: Insufficiently Active (07/10/2024)   Exercise Vital Sign    Days of Exercise per Week: 3 days    Minutes of Exercise per Session: 30 min  Stress: No Stress Concern Present (07/10/2024)   [employer] of Occupational Health - Occupational Stress Questionnaire    Feeling of Stress: Only a little  Social Connections: Moderately Isolated (07/10/2024)   Social Connection and Isolation Panel    Frequency of Communication with Friends and Family: More than three times a week    Frequency of Social Gatherings with Friends and Family: Once a week    Attends Religious Services: Never    Database administrator or  Organizations: Yes    Attends Engineer, structural: More than 4 times per year    Marital Status: Divorced    Tobacco Counseling Counseling given: Not Answered    Clinical Intake:  Pre-visit preparation completed: Yes  Pain : No/denies pain     Diabetes: Yes CBG done?: No Did pt. bring in CBG monitor from home?: No  Lab Results  Component Value Date   HGBA1C 8.0 (H) 05/15/2024   HGBA1C 8.2 (H) 03/13/2024   HGBA1C 7.6 (H) 04/19/2023     How often do you need to have someone help you when you read instructions, pamphlets, or other written materials  from your doctor or pharmacy?: 1 - Never  Interpreter Needed?: No  Information entered by :: Charmaine Bloodgood LPN   Activities of Daily Living     07/03/2024    8:54 AM 02/20/2024    8:45 AM  In your present state of health, do you have any difficulty performing the following activities:  Hearing? 0 0  Vision? 0 0  Difficulty concentrating or making decisions? 0 0  Walking or climbing stairs? 0 0  Dressing or bathing? 0 0  Doing errands, shopping? 0 0  Preparing Food and eating ? N N  Using the Toilet? N N  In the past six months, have you accidently leaked urine? N N  Do you have problems with loss of bowel control? N N  Managing your Medications? N N  Managing your Finances? N N  Housekeeping or managing your Housekeeping? N N    Patient Care Team: Jason Leita Repine, FNP as PCP - General (Internal Medicine) Harrietta Kurtz, OD (Optometry)  I have updated your Care Teams any recent Medical Services you may have received from other providers in the past year.     Assessment:   This is a routine wellness examination for Katie Simon.  Hearing/Vision screen Hearing Screening - Comments:: Denies hearing difficulties   Vision Screening - Comments:: Wears rx glasses - up to date with routine eye exams with [facility]    Goals Addressed             This Visit's Progress    Maintain health  and independent   On track      Depression Screen     07/10/2024   11:18 AM 05/15/2024    8:25 AM 04/19/2023    9:57 AM 02/11/2023   12:45 PM 04/13/2022    9:36 AM 02/16/2022    1:04 PM 02/09/2022    1:40 PM  PHQ 2/9 Scores  PHQ - 2 Score 1 1 1 1 3 1 1   PHQ- 9 Score    1 5      Fall Risk     07/03/2024    8:54 AM 02/20/2024    8:45 AM 04/19/2023    9:57 AM 02/11/2023    1:36 PM 02/04/2023    8:50 AM  Fall Risk   Falls in the past year? 0 0 0 0 0  Number falls in past yr: 0  0 0 0  Injury with Fall? 0 0 0 0 0  Risk for fall due to : No Fall Risks  No Fall Risks No Fall Risks No Fall Risks  Follow up Falls prevention discussed;Education provided;Falls evaluation completed  Falls evaluation completed Falls prevention discussed Falls evaluation completed;Falls prevention discussed    MEDICARE RISK AT HOME:  Medicare Risk at Home Any stairs in or around the home?: (Patient-Rptd) Yes If so, are there any without handrails?: (Patient-Rptd) No Home free of loose throw rugs in walkways, pet beds, electrical cords, etc?: (Patient-Rptd) No Adequate lighting in your home to reduce risk of falls?: (Patient-Rptd) Yes Life alert?: (Patient-Rptd) No Use of a cane, walker or w/c?: (Patient-Rptd) No Grab bars in the bathroom?: (Patient-Rptd) No Shower chair or bench in shower?: (Patient-Rptd) No Elevated toilet seat or a handicapped toilet?: (Patient-Rptd) No  TIMED UP AND GO:  Was the test performed?  No  Cognitive Function: 6CIT completed        07/10/2024   11:21 AM 02/11/2023   12:42 PM  6CIT Screen  What Year? 0 points  0 points  What month? 0 points 0 points  What time? 0 points 0 points  Count back from 20 0 points 0 points  Months in reverse 0 points 0 points  Repeat phrase 0 points 2 points  Total Score 0 points 2 points    Immunizations Immunization History  Administered Date(s) Administered   PFIZER(Purple Top)SARS-COV-2 Vaccination 01/25/2020, 02/18/2020, 11/06/2020    PNEUMOCOCCAL CONJUGATE-20 03/12/2021, 04/15/2022   Td 08/12/2023   Tdap 02/10/2012, 08/24/2017   Zoster Recombinant(Shingrix) 04/15/2022, 03/20/2024    Screening Tests Health Maintenance  Topic Date Due   FOOT EXAM  Never done   OPHTHALMOLOGY EXAM  Never done   Diabetic kidney evaluation - Urine ACR  Never done   Influenza Vaccine  Never done   HEMOGLOBIN A1C  11/15/2024   MAMMOGRAM  04/23/2025   Diabetic kidney evaluation - eGFR measurement  05/15/2025   Medicare Annual Wellness (AWV)  07/10/2025   Colonoscopy  09/08/2026   DTaP/Tdap/Td (4 - Td or Tdap) 08/11/2033   Pneumococcal Vaccine: 50+ Years  Completed   DEXA SCAN  Completed   Hepatitis C Screening  Completed   Zoster Vaccines- Shingrix  Completed   HPV VACCINES  Aged Out   Meningococcal B Vaccine  Aged Out   COVID-19 Vaccine  Discontinued    Health Maintenance Items Addressed: Information provided on vaccine recommendations; Requesting records for last diabetic eye exam    Additional Screening:  Vision Screening: Recommended annual ophthalmology exams for early detection of glaucoma and other disorders of the eye. Is the patient up to date with their annual eye exam?  Yes  Who is the provider or what is the name of the office in which the patient attends annual eye exams? [facility]   Dental Screening: Recommended annual dental exams for proper oral hygiene  Community Resource Referral / Chronic Care Management: CRR required this visit?  No   CCM required this visit?  No   Plan:    I have personally reviewed and noted the following in the patient's chart:   Medical and social history Use of alcohol, tobacco or illicit drugs  Current medications and supplements including opioid prescriptions. Patient is not currently taking opioid prescriptions. Functional ability and status Nutritional status Physical activity Advanced directives List of other physicians Hospitalizations, surgeries, and ER  visits in previous 12 months Vitals Screenings to include cognitive, depression, and falls Referrals and appointments  In addition, I have reviewed and discussed with patient certain preventive protocols, quality metrics, and best practice recommendations. A written personalized care plan for preventive services as well as general preventive health recommendations were provided to patient.   Lavelle Pfeiffer [place], [place]   [id number]   After Visit Summary: (MyChart) Due to this being a telephonic visit, the after visit summary with patients personalized plan was offered to patient via MyChart   Notes: Nothing significant to report at this time.

## 2024-07-10 NOTE — Patient Instructions (Signed)
 Katie Simon,  Thank you for taking the time for your Medicare Wellness Visit. I appreciate your continued commitment to your health goals. Please review the care plan we discussed, and feel free to reach out if I can assist you further.  Medicare recommends these wellness visits once per year to help you and your care team stay ahead of potential health issues. These visits are designed to focus on prevention, allowing your provider to concentrate on managing your acute and chronic conditions during your regular appointments.  Please note that Annual Wellness Visits do not include a physical exam. Some assessments may be limited, especially if the visit was conducted virtually. If needed, we may recommend a separate in-person follow-up with your provider.  Ongoing Care Seeing your primary care provider every 3 to 6 months helps us  monitor your health and provide consistent, personalized care.   Referrals If a referral was made during today's visit and you haven't received any updates within two weeks, please contact the referred provider directly to check on the status.  Recommended Screenings:  Health Maintenance  Topic Date Due   Complete foot exam   Never done   Eye exam for diabetics  Never done   Yearly kidney health urinalysis for diabetes  Never done   Flu Shot  Never done   Hemoglobin A1C  11/15/2024   Mammogram  04/23/2025   Yearly kidney function blood test for diabetes  05/15/2025   Medicare Annual Wellness Visit  07/10/2025   Colon Cancer Screening  09/08/2026   DTaP/Tdap/Td vaccine (4 - Td or Tdap) 08/11/2033   Pneumococcal Vaccine for age over 31  Completed   DEXA scan (bone density measurement)  Completed   Hepatitis C Screening  Completed   Zoster (Shingles) Vaccine  Completed   HPV Vaccine  Aged Out   Meningitis B Vaccine  Aged Out   COVID-19 Vaccine  Discontinued       07/10/2024   11:21 AM  Advanced Directives  Does Patient Have a Medical Advance Directive?  No  Would patient like information on creating a medical advance directive? Yes (MAU/Ambulatory/Procedural Areas - Information given)   Advance Care Planning is important because it: Ensures you receive medical care that aligns with your values, goals, and preferences. Provides guidance to your family and loved ones, reducing the emotional burden of decision-making during critical moments.  Information on Advanced Care Planning can be found at [place]  Secretary of [facility] Advance Health Care Directives Advance Health Care Directives ([email or web address]   Vision: Annual vision screenings are recommended for early detection of glaucoma, cataracts, and diabetic retinopathy. These exams can also reveal signs of chronic conditions such as diabetes and high blood pressure.  Dental: Annual dental screenings help detect early signs of oral cancer, gum disease, and other conditions linked to overall health, including heart disease and diabetes.  Please see the attached documents for additional preventive care recommendations.

## 2024-08-23 ENCOUNTER — Ambulatory Visit: Admitting: Family

## 2024-08-27 ENCOUNTER — Ambulatory Visit: Admitting: Family Medicine

## 2024-09-03 ENCOUNTER — Encounter: Payer: Self-pay | Admitting: Radiology

## 2024-09-12 NOTE — Progress Notes (Signed)
 Katie Simon                                          MRN: [id number]   09/12/2024   The [facility]I Quality Team Specialist reviewed this patient medical record for the purposes of chart review for care gap closure. The following were reviewed: chart review for care gap closure-kidney health evaluation for diabetes:eGFR  and uACR.    VBCI Quality Team

## 2024-09-25 ENCOUNTER — Other Ambulatory Visit: Payer: Self-pay | Admitting: *Deleted

## 2024-09-25 ENCOUNTER — Encounter: Payer: Self-pay | Admitting: *Deleted

## 2024-09-25 MED ORDER — METFORMIN HCL ER 500 MG PO TB24: ORAL_TABLET | ORAL | 0 refills | Status: AC

## 2024-09-25 NOTE — Telephone Encounter (Signed)
 Lvm for her to sched

## 2024-09-25 NOTE — Telephone Encounter (Signed)
 [facility] faxed over request for metformin  500mg .  Last filled date there was 06/13/24.

## 2025-07-23 ENCOUNTER — Ambulatory Visit
